# Patient Record
Sex: Female | Born: 1970 | Race: White | Hispanic: No | Marital: Married | Smoking: Never smoker
Health system: Southern US, Community
[De-identification: ages and names within clinical notes are randomized; demographics above are authoritative.]

## PROBLEM LIST (undated history)

## (undated) DIAGNOSIS — R5381 Other malaise: Secondary | ICD-10-CM

## (undated) DIAGNOSIS — Q796 Ehlers-Danlos syndrome, unspecified: Secondary | ICD-10-CM

## (undated) DIAGNOSIS — D649 Anemia, unspecified: Secondary | ICD-10-CM

## (undated) DIAGNOSIS — R5383 Other fatigue: Secondary | ICD-10-CM

## (undated) DIAGNOSIS — M26609 Unspecified temporomandibular joint disorder, unspecified side: Secondary | ICD-10-CM

## (undated) DIAGNOSIS — A770 Spotted fever due to Rickettsia rickettsii: Secondary | ICD-10-CM

## (undated) DIAGNOSIS — M5020 Other cervical disc displacement, unspecified cervical region: Secondary | ICD-10-CM

## (undated) DIAGNOSIS — M5030 Other cervical disc degeneration, unspecified cervical region: Secondary | ICD-10-CM

## (undated) DIAGNOSIS — K220 Achalasia of cardia: Secondary | ICD-10-CM

## (undated) DIAGNOSIS — S0993XA Unspecified injury of face, initial encounter: Secondary | ICD-10-CM

## (undated) DIAGNOSIS — G5790 Unspecified mononeuropathy of unspecified lower limb: Secondary | ICD-10-CM

---

## 2005-12-24 HISTORY — PX: OTHER SURGICAL HISTORY: SHX169

## 2015-08-04 ENCOUNTER — Emergency Department (HOSPITAL_COMMUNITY)

## 2015-08-04 ENCOUNTER — Encounter (HOSPITAL_COMMUNITY): Payer: Self-pay | Admitting: *Deleted

## 2015-08-04 ENCOUNTER — Emergency Department (HOSPITAL_COMMUNITY)
Admission: EM | Admit: 2015-08-04 | Discharge: 2015-08-04 | Disposition: A | Discharge status: Home / Self Care | Attending: Emergency Medicine | Admitting: Emergency Medicine

## 2015-08-04 DIAGNOSIS — R079 Chest pain, unspecified: Secondary | ICD-10-CM | POA: Diagnosis present

## 2015-08-04 DIAGNOSIS — Z79899 Other long term (current) drug therapy: Secondary | ICD-10-CM | POA: Insufficient documentation

## 2015-08-04 DIAGNOSIS — Z862 Personal history of diseases of the blood and blood-forming organs and certain disorders involving the immune mechanism: Secondary | ICD-10-CM | POA: Insufficient documentation

## 2015-08-04 HISTORY — DX: Anemia, unspecified: D64.9

## 2015-08-04 LAB — BASIC METABOLIC PANEL
Anion gap: 10 (ref 5–15)
BUN: 5 mg/dL — AB (ref 6–20)
CALCIUM: 9.4 mg/dL (ref 8.9–10.3)
CO2: 25 mmol/L (ref 22–32)
CREATININE: 0.81 mg/dL (ref 0.44–1.00)
Chloride: 102 mmol/L (ref 101–111)
GFR calc Af Amer: >60 mL/min (ref >60)
GFR calc non Af Amer: >60 mL/min (ref >60)
GLUCOSE: 100 mg/dL — AB (ref 65–99)
Potassium: 3.8 mmol/L (ref 3.5–5.1)
Sodium: 137 mmol/L (ref 135–145)

## 2015-08-04 LAB — CBC
HCT: 35.2 % — ABNORMAL LOW (ref 36.0–46.0)
HEMOGLOBIN: 11.4 g/dL — AB (ref 12.0–15.0)
MCH: 27.1 pg (ref 26.0–34.0)
MCHC: 32.4 g/dL (ref 30.0–36.0)
MCV: 83.8 fL (ref 78.0–100.0)
Platelets: 269 10*3/uL (ref 150–400)
RBC: 4.2 MIL/uL (ref 3.87–5.11)
RDW: 13.9 % (ref 11.5–15.5)
WBC: 4.2 10*3/uL (ref 4.0–10.5)

## 2015-08-04 LAB — I-STAT TROPONIN, ED: TROPONIN I, POC: 0 ng/mL (ref 0.00–0.08)

## 2015-08-04 LAB — TROPONIN I

## 2015-08-04 MED ORDER — IOHEXOL 300 MG/ML  SOLN
150.0000 mL | Freq: Once | INTRAMUSCULAR | Status: DC | PRN
  Administered 2015-08-04: 100 mL via ORAL
  Filled 2015-08-04: qty 150

## 2015-08-04 NOTE — ED Provider Notes (Signed)
CSN: [id number]     Arrival date & time 08/04/15  1020 History   First MD Initiated Contact with Patient 08/04/15 1231     Chief Complaint  Patient presents with  . Chest Pain     (Consider location/radiation/quality/duration/timing/severity/associated sxs/prior Treatment) HPI Comments: 44 year old female with past medical history including laryngopharyngeal reflux, anemia who presents with chest pain. The patient has a long-standing history of reflux for which she has followed with ENT in the past. She was recently evaluated by gastroenterology and had an esophageal manometry study done 3 days ago. The procedure was uncomfortable but no acute complications. Yesterday, she began having occasional episodes of central chest pain that radiates up towards bilateral shoulders and sometimes to her back. The pain occurs randomly and seems to be worse after eating or drinking. Nothing makes the pain better. She denies any associated shortness of breath, vomiting, diarrhea, abdominal pain, or blood in her stool. She saw her ENT yesterday who noted no abnormalities on bedside scope. She presents today due to the persistence of the pain. No personal history of blood clots. No recent travel. No OCP use.  Patient is a 44 y.o. female presenting with chest pain. The history is provided by the patient.  Chest Pain   Past Medical History  Diagnosis Date  . Laryngopharyngeal reflux (LPR)   . Anemia    History reviewed. No pertinent past surgical history. History reviewed. No pertinent family history. Social History  Substance Use Topics  . Smoking status: Never Smoker   . Smokeless tobacco: None  . Alcohol Use: No   OB History    No data available     Review of Systems  Cardiovascular: Positive for chest pain.    10 Systems reviewed and are negative for acute change except as noted in the HPI.   Allergies  Review of patient's allergies indicates no known allergies.  Home Medications    Prior to Admission medications   Medication Sig Start Date End Date Taking? Authorizing Provider  pantoprazole (PROTONIX) 40 MG tablet Take 40 mg by mouth daily.  08/03/15  Yes Historical Provider, MD  ranitidine (ZANTAC) 300 MG capsule Take 300 mg by mouth every evening.  07/08/15  Yes Historical Provider, MD   BP 114/84 mmHg  Pulse 67  Temp(Src) 98.3 F (36.8 C) (Oral)  Resp 12  Ht 5\' 7"  (1.702 m)  Wt 164 lb (74.39 kg)  BMI 25.68 kg/m2  SpO2 100%  LMP 08/01/2015 Physical Exam  Constitutional: She is oriented to person, place, and time. She appears well-developed and well-nourished. No distress.  HENT:  Head: Normocephalic and atraumatic.  Moist mucous membranes  Eyes: Conjunctivae are normal. Pupils are equal, round, and reactive to light.  Neck: Neck supple.  Cardiovascular: Normal rate, regular rhythm and normal heart sounds.   No murmur heard. Pulmonary/Chest: Effort normal and breath sounds normal.  Abdominal: Soft. Bowel sounds are normal. She exhibits no distension. There is no tenderness.  Musculoskeletal: She exhibits no edema.  Neurological: She is alert and oriented to person, place, and time.  Fluent speech  Skin: Skin is warm and dry.  Psychiatric: She has a normal mood and affect. Judgment normal.  pleasant  Nursing note and vitals reviewed.   ED Course  Procedures (including critical care time) Labs Review Labs Reviewed  BASIC METABOLIC PANEL - Abnormal; Notable for the following:    Glucose, Bld 100 (*)    BUN 5 (*)    All other components within normal  limits  CBC - Abnormal; Notable for the following:    Hemoglobin 11.4 (*)    HCT 35.2 (*)    All other components within normal limits  TROPONIN I  Rosezena Sensor, ED    Imaging Review Dg Chest 2 View  08/04/2015   CLINICAL DATA:  Anterior chest pain.  EXAM: CHEST  2 VIEW  COMPARISON:  None.  FINDINGS: The heart size and mediastinal contours are within normal limits. Both lungs are clear. The  visualized skeletal structures are unremarkable.  IMPRESSION: Normal exam.   Electronically Signed   By: Francene Boyers M.D.   On: 08/04/2015 11:11   Dg Esophagus W/water Sol Cm  08/04/2015   CLINICAL DATA:  Esophageal manometry 3 days ago. Chest pain. Rule out perforation.  EXAM: ESOPHOGRAM/BARIUM SWALLOW  TECHNIQUE: Single contrast examination was performed using Omnipaque 300 followed by thin barium.  FLUOROSCOPY TIME:  Radiation Exposure Index (as provided by the fluoroscopic device):  If the device does not provide the exposure index:  Fluoroscopy Time:  2 Min and 18 seconds  Number of Acquired Images:  COMPARISON:  None.  FINDINGS: The patient swallowed Omnipaque contrast and the esophagus was imaged in multiple projections. This was negative for leak. The patient then drank thin barium in upright and prone positions.  Negative for esophageal leak. Negative for extravasation of contrast. Small hiatal hernia. No stricture or mass lesion. No gastroesophageal reflux was elicited.  IMPRESSION: Negative for esophageal leak. No stricture or mass. Small hiatal hernia without reflux.   Electronically Signed   By: Marlan Palau M.D.   On: 08/04/2015 14:21     EKG Interpretation   Date/Time:  Thursday August 04 2015 10:30:18 EDT Ventricular Rate:  73 PR Interval:  136 QRS Duration: 76 QT Interval:  392 QTC Calculation: 431 R Axis:   79 Text Interpretation:  Normal sinus rhythm Normal ECG Confirmed by Micaella Gitto  MD, Milanni Ayub ([id number]) on 08/04/2015 12:33:40 PM      MDM   Final diagnoses:  Chest pain   44 year old female who presents with 2 days as chest pain in the setting of recent esophageal manometry study. At presentation, the patient was well-appearing with normal vital signs. No abnormal respiratory sounds and no abdominal tenderness on exam. EKG on arrival showed sinus rhythm with no ischemic changes. Obtained above lab work which showed stable anemia but was otherwise unremarkable.  Because  of patient's recent esophageal study, obtained an esophagram to evaluate for esophageal perforation.  Esophagram was negative for esophageal leak. Patient is PERC negative thus PE is very unlikely. She has no risk factors for early heart disease and HEART score is less than 3 based on negative serial troponins. Based on the patient's well appearance and reassuring workup, I feel that she is safe for discharge home. I have instructed her to follow-up as previously planned with ENT about her recent manometry study. She is currently trying to establish care with a PCP and I have provided her with outpatient clinic information. Have reviewed return precautions including worsening pain, shortness of breath, fever, or any new or alarming symptoms and the patient voiced understanding. Patient discharged in satisfactory condition.   Laurence Spates, MD 08/04/15 [phone]

## 2015-08-04 NOTE — Discharge Instructions (Signed)

## 2015-08-04 NOTE — ED Notes (Signed)
Dr. Little at bedside.  

## 2015-08-04 NOTE — ED Notes (Signed)
Pt reports having recent procedure done on esophagus on Monday, now having pains to different locations in chest and back. Denies sob.

## 2015-08-04 NOTE — ED Notes (Signed)
Pt A&OX4, ambulatory at d/c with steady gait, NAD 

## 2015-09-30 ENCOUNTER — Encounter (HOSPITAL_COMMUNITY)

## 2015-10-01 ENCOUNTER — Encounter (HOSPITAL_COMMUNITY): Payer: Self-pay | Admitting: Nurse Practitioner

## 2015-10-01 ENCOUNTER — Emergency Department (HOSPITAL_COMMUNITY)
Admission: EM | Admit: 2015-10-01 | Discharge: 2015-10-01 | Disposition: A | Discharge status: Home / Self Care | Attending: Emergency Medicine | Admitting: Emergency Medicine

## 2015-10-01 DIAGNOSIS — Z79899 Other long term (current) drug therapy: Secondary | ICD-10-CM | POA: Insufficient documentation

## 2015-10-01 DIAGNOSIS — R079 Chest pain, unspecified: Secondary | ICD-10-CM | POA: Diagnosis present

## 2015-10-01 DIAGNOSIS — Z8709 Personal history of other diseases of the respiratory system: Secondary | ICD-10-CM | POA: Diagnosis not present

## 2015-10-01 DIAGNOSIS — Z862 Personal history of diseases of the blood and blood-forming organs and certain disorders involving the immune mechanism: Secondary | ICD-10-CM | POA: Diagnosis not present

## 2015-10-01 LAB — CBC
HCT: 33.2 % — ABNORMAL LOW (ref 36.0–46.0)
Hemoglobin: 10.3 g/dL — ABNORMAL LOW (ref 12.0–15.0)
MCH: 25.3 pg — ABNORMAL LOW (ref 26.0–34.0)
MCHC: 31 g/dL (ref 30.0–36.0)
MCV: 81.6 fL (ref 78.0–100.0)
Platelets: 301 10*3/uL (ref 150–400)
RBC: 4.07 MIL/uL (ref 3.87–5.11)
RDW: 13.9 % (ref 11.5–15.5)
WBC: 4.5 10*3/uL (ref 4.0–10.5)

## 2015-10-01 LAB — BASIC METABOLIC PANEL
Anion gap: 8 (ref 5–15)
BUN: 11 mg/dL (ref 6–20)
CALCIUM: 8.9 mg/dL (ref 8.9–10.3)
CO2: 27 mmol/L (ref 22–32)
Chloride: 99 mmol/L — ABNORMAL LOW (ref 101–111)
Creatinine, Ser: 0.75 mg/dL (ref 0.44–1.00)
GFR calc Af Amer: >60 mL/min (ref >60)
GLUCOSE: 103 mg/dL — AB (ref 65–99)
Potassium: 3.9 mmol/L (ref 3.5–5.1)
Sodium: 134 mmol/L — ABNORMAL LOW (ref 135–145)

## 2015-10-01 LAB — I-STAT TROPONIN, ED: Troponin i, poc: 0 ng/mL (ref 0.00–0.08)

## 2015-10-01 MED ORDER — KETOROLAC TROMETHAMINE 15 MG/ML IJ SOLN: 15.0000 mg | Freq: Once | INTRAMUSCULAR | Status: DC

## 2015-10-01 MED ORDER — GI COCKTAIL ~~LOC~~: 30.0000 mL | Freq: Once | ORAL | Status: DC

## 2015-10-01 NOTE — Discharge Instructions (Signed)
If you were given medicines take as directed.  If you are on coumadin or contraceptives realize their levels and effectiveness is altered by many different medicines.  If you have any reaction (rash, tongues swelling, other) to the medicines stop taking and see a physician.    If your blood pressure was elevated in the ER make sure you follow up for management with a primary doctor or return for chest pain, shortness of breath or stroke symptoms.  Please follow up as directed and return to the ER or see a physician for new or worsening symptoms.  Thank you. Filed Vitals:   10/01/15 1734 10/01/15 1735 10/01/15 1736 10/01/15 1800  BP: 121/66  121/66 114/76  Pulse: 83 92 66 74  Temp:   98.1 F (36.7 C)   TempSrc:   Oral   Resp:  SpO2: 100% 97% 100% 100%

## 2015-10-01 NOTE — ED Notes (Signed)
Pt c/o middle/left sided CP, sob intermittent over past several weeks, sometimes wakes her from her sleep. She also c/o increased fatigue this week.  She is currently being worked up for GI issues/acid reflux/anemia by [employer], she was supposed to have diagnostic colonoscopy/endoscopy this week but had to cancel. She reports nausea/mild constipation, denies vomiting. Denies diarrhea.

## 2015-10-01 NOTE — ED Provider Notes (Signed)
CSN: [id number]     Arrival date & time 10/01/15  1602 History   First MD Initiated Contact with Patient 10/01/15 1719     Chief Complaint  Patient presents with  . Chest Pain     (Consider location/radiation/quality/duration/timing/severity/associated sxs/prior Treatment) HPI Comments: 44 year-old female with history of reflux, currently being worked up by gastroenterology for sense of food getting stuck with swallowing presents with intermittent left-sided chest pain for the past 2 weeks. Very brief randomassociations. No exertional component however patient has not been exercising recently due to reflux. Patient has been elevating her bed and not eating at night etc. to improve however symptoms have worsened recently. Patient is scheduled to have a EGD and colonoscopy in November. No diaphoresis. No blood clot risk factors.  Patient is a 44 y.o. female presenting with chest pain. The history is provided by the patient.  Chest Pain Associated symptoms: no abdominal pain, no back pain, no fever, no headache, no shortness of breath and not vomiting     Past Medical History  Diagnosis Date  . Laryngopharyngeal reflux (LPR)   . Anemia    History reviewed. No pertinent past surgical history. History reviewed. No pertinent family history. Social History  Substance Use Topics  . Smoking status: Never Smoker   . Smokeless tobacco: None  . Alcohol Use: No   OB History    No data available     Review of Systems  Constitutional: Negative for fever and chills.  HENT: Negative for congestion.   Eyes: Negative for visual disturbance.  Respiratory: Negative for shortness of breath.   Cardiovascular: Positive for chest pain.  Gastrointestinal: Negative for vomiting and abdominal pain.  Genitourinary: Negative for dysuria and flank pain.  Musculoskeletal: Negative for back pain, neck pain and neck stiffness.  Skin: Negative for rash.  Neurological: Negative for light-headedness and  headaches.      Allergies  Review of patient's allergies indicates no known allergies.  Home Medications   Prior to Admission medications   Medication Sig Start Date End Date Taking? Authorizing Provider  pantoprazole (PROTONIX) 40 MG tablet Take 40 mg by mouth daily.  08/03/15   Historical Provider, MD  ranitidine (ZANTAC) 300 MG capsule Take 300 mg by mouth every evening.  07/08/15   Historical Provider, MD   BP 114/76 mmHg  Pulse 74  Temp(Src) 98.1 F (36.7 C) (Oral)  Resp 17  SpO2 100%  LMP 09/27/2015 Physical Exam  Constitutional: She is oriented to person, place, and time. She appears well-developed and well-nourished.  HENT:  Head: Normocephalic and atraumatic.  Eyes: Conjunctivae are normal. Right eye exhibits no discharge. Left eye exhibits no discharge.  Neck: Normal range of motion. Neck supple. No tracheal deviation present.  Cardiovascular: Normal rate, regular rhythm and intact distal pulses.   No murmur heard. Pulmonary/Chest: Effort normal and breath sounds normal.  Abdominal: Soft. She exhibits no distension. There is no tenderness. There is no guarding.  Musculoskeletal: She exhibits no edema.  Neurological: She is alert and oriented to person, place, and time.  Skin: Skin is warm. No rash noted.  Psychiatric: She has a normal mood and affect.  Nursing note and vitals reviewed.   ED Course  Procedures (including critical care time) Labs Review Labs Reviewed  BASIC METABOLIC PANEL - Abnormal; Notable for the following:    Sodium 134 (*)    Chloride 99 (*)    Glucose, Bld 103 (*)    All other components within normal limits  CBC - Abnormal; Notable for the following:    Hemoglobin 10.3 (*)    HCT 33.2 (*)    MCH 25.3 (*)    All other components within normal limits  I-STAT TROPOININ, ED    Imaging Review No results found. I have personally reviewed and evaluated these images and lab results as part of my medical decision-making.   EKG  Interpretation   Date/Time:  Saturday October 01 2015 16:08:40 EDT Ventricular Rate:  80 PR Interval:  152 QRS Duration: 82 QT Interval:  402 QTC Calculation: 463 R Axis:   67 Text Interpretation:  Normal sinus rhythm Normal ECG Confirmed by Maybree Riling   MD, Makeila Yamaguchi ([id number]) on 10/01/2015 5:19:15 PM      MDM   Final diagnoses:  Chest pain, unspecified chest pain type    Patient presents with intermittent atypical left-sided chest pain. Patient very low risk cardiac, EKG unremarkable reviewed, troponin negative. Patient very low risk pulmonary embolism, PE RC negative.  Offered and discussed different treatments, patient wishes to hold on any medicines at this time as she is following with gastroenterology for her other medical problem.   Results and differential diagnosis were discussed with the patient/parent/guardian. Xrays were independently reviewed by myself.  Close follow up outpatient was discussed, comfortable with the plan.   Medications - No data to display  Filed Vitals:   10/01/15 1735 10/01/15 1736 10/01/15 1800 10/01/15 1830  BP:  121/66 114/76 122/77  Pulse: 92 66 74 67  Temp:  98.1 F (36.7 C)    TempSrc:  Oral    Resp: SpO2: 97% 100% 100% 99%    Final diagnoses:  Chest pain, unspecified chest pain type      Blane Ohara, MD 10/01/15 1900

## 2015-10-03 ENCOUNTER — Telehealth (HOSPITAL_BASED_OUTPATIENT_CLINIC_OR_DEPARTMENT_OTHER): Payer: Self-pay | Admitting: Emergency Medicine

## 2015-10-04 ENCOUNTER — Other Ambulatory Visit (HOSPITAL_COMMUNITY): Payer: Self-pay

## 2015-10-05 ENCOUNTER — Encounter (HOSPITAL_COMMUNITY)

## 2016-02-27 ENCOUNTER — Encounter (HOSPITAL_COMMUNITY): Payer: Self-pay | Admitting: Family Medicine

## 2016-02-27 ENCOUNTER — Emergency Department (HOSPITAL_COMMUNITY)
Admission: EM | Admit: 2016-02-27 | Discharge: 2016-02-28 | Disposition: A | Discharge status: Home / Self Care | Attending: Emergency Medicine | Admitting: Emergency Medicine

## 2016-02-27 DIAGNOSIS — Z79899 Other long term (current) drug therapy: Secondary | ICD-10-CM | POA: Insufficient documentation

## 2016-02-27 DIAGNOSIS — R1310 Dysphagia, unspecified: Secondary | ICD-10-CM | POA: Diagnosis not present

## 2016-02-27 DIAGNOSIS — R51 Headache: Secondary | ICD-10-CM | POA: Insufficient documentation

## 2016-02-27 DIAGNOSIS — Z8709 Personal history of other diseases of the respiratory system: Secondary | ICD-10-CM | POA: Insufficient documentation

## 2016-02-27 DIAGNOSIS — R061 Stridor: Secondary | ICD-10-CM | POA: Diagnosis not present

## 2016-02-27 DIAGNOSIS — R202 Paresthesia of skin: Secondary | ICD-10-CM | POA: Diagnosis present

## 2016-02-27 DIAGNOSIS — D649 Anemia, unspecified: Secondary | ICD-10-CM | POA: Diagnosis not present

## 2016-02-27 DIAGNOSIS — R200 Anesthesia of skin: Secondary | ICD-10-CM | POA: Insufficient documentation

## 2016-02-27 DIAGNOSIS — F419 Anxiety disorder, unspecified: Secondary | ICD-10-CM | POA: Diagnosis not present

## 2016-02-27 DIAGNOSIS — H9201 Otalgia, right ear: Secondary | ICD-10-CM | POA: Insufficient documentation

## 2016-02-27 DIAGNOSIS — R220 Localized swelling, mass and lump, head: Secondary | ICD-10-CM | POA: Diagnosis not present

## 2016-02-27 DIAGNOSIS — K0889 Other specified disorders of teeth and supporting structures: Secondary | ICD-10-CM | POA: Diagnosis not present

## 2016-02-27 MED ORDER — IBUPROFEN 400 MG PO TABS
400.0000 mg | ORAL_TABLET | Freq: Once | ORAL | Status: AC
  Administered 2016-02-27: 400 mg via ORAL

## 2016-02-27 MED ORDER — IBUPROFEN 400 MG PO TABS
ORAL_TABLET | ORAL | Status: AC
  Filled 2016-02-27: qty 1

## 2016-02-27 NOTE — ED Notes (Addendum)
Pt here for right sided dental pain, facial swelling and sts she feels like the swelling is going into the right side of her tongue and into throat.sts trouble swallowing and feels like it is getting worse with warm sensations into her chest ans shoulders. sts she has been wearing night guards due to jaw pain. sts she has been seeing a massage therapist. sts she is taking a muscle relaxer.

## 2016-02-28 ENCOUNTER — Emergency Department (HOSPITAL_COMMUNITY)

## 2016-02-28 DIAGNOSIS — R202 Paresthesia of skin: Secondary | ICD-10-CM

## 2016-02-28 NOTE — Consult Note (Signed)
Neurology Consultation Reason for Consult: Right facial numbness Referring Physician: Horton, C  CC: Right facial numbness  History is obtained from: Patient  HPI: Kathryn Riggs is a 45 y.o. female who initially presented due to an abnormal feeling in her ear as well as funny feeling in the back of her tongue. She states that she felt like her face was swollen and therefore presented to the emergency room. While she was in the emergency department waiting room for multiple hours, she began noticing that she had some right facial numbness. She did not notice any arm numbness at that time. When the nurse checked her sensation, she states that she noticed at that time that she did not feel things as well on her right arm as well. This is not something that she had noticed prior to being formally assessed.  She notes that she had some abnormal sensation after therapist doing therapy for TMJ pinched her nose on Friday. She continues to have a full sensation in her right ear and the numbness is improving, though still remains to some degree.  LKW: 3 PM tpa given?: no, outside of window, mild symptoms Premorbid modified rankin scale: 0    ROS: A 14 point ROS was performed and is negative except as noted in the HPI.    Past Medical History  Diagnosis Date  . Laryngopharyngeal reflux (LPR)   . Anemia      Family history: Father a stroke  Social History:  reports that she has never smoked. She does not have any smokeless tobacco history on file. She reports that she does not drink alcohol or use illicit drugs.   Exam: Current vital signs: BP 147/98 mmHg  Pulse 89  Temp(Src) 98 F (36.7 C) (Oral)  Resp 20  SpO2 100% Vital signs in last 24 hours: Temp:  [98 F (36.7 C)] 98 F (36.7 C) (03/06 1820) Pulse Rate:  [84-89] 89 (03/07 0024) Resp:  [20] 20 (03/06 2042) BP: (147-154)/(98-109) 147/98 mmHg (03/07 0022) SpO2:  [100 %] 100 % (03/07 0024)   Physical Exam  Constitutional:  Appears well-developed and well-nourished.  Psych: Affect appropriate to situation Eyes: No scleral injection HENT: No OP obstrucion Head: Normocephalic.  Cardiovascular: Normal rate and regular rhythm.  Respiratory: Effort normal and breath sounds normal to anterior ascultation GI: Soft.  No distension. There is no tenderness.  Skin: WDI  Neuro: Mental Status: Patient is awake, alert, oriented to person, place, month, year, and situation. Patient is able to give a clear and coherent history. No signs of aphasia or neglect Cranial Nerves: II: Visual Fields are full. Pupils are equal, round, and reactive to light.   III,IV, VI: EOMI without ptosis or diploplia.  V: Facial sensation is decreased on the right side of the face VII: Facial movement is symmetric.  VIII: hearing is intact to voice X: Uvula elevates symmetrically XI: Shoulder shrug is symmetric. XII: tongue is midline without atrophy or fasciculations.  Motor: Tone is normal. Bulk is normal. 5/5 strength was present in all four extremities.  Sensory: Sensation to pinprick in the bilateral arms and legs, and upper arm single time she stated that it was about 80% of the left side, but then on subsequent examinations is more equal. Cerebellar: FNF intact bilaterally   I have reviewed labs in epic and the results pertinent to this consultation are: BMP-unremarkable  Impression: 45 year old female with paresthesias of the right face setting of sensation of facial swelling. I think possibilities would include  some irritation the trigeminal nerve triggered initially by dental work and now exacerbated by the therapy that she had. This would not explain her arm symptoms. Also possible the migraine variant given that does have mild headache.  Recommendations: 1) MRI brain w/o contrast.  2) If negative, no further workup at this time.    Ritta Slot, MD [facility] [phone]  If 7pm- 7am, please page  neurology on call as listed in AMION.

## 2016-02-28 NOTE — Discharge Instructions (Signed)
Your MRI was negative.  You have no evidence of tongue swelling or oral swelling.  Please follow-up with your dentist and your PCP.  Paresthesia Paresthesia is an abnormal burning or prickling sensation. This sensation is generally felt in the hands, arms, legs, or feet. However, it may occur in any part of the body. Usually, it is not painful. The feeling may be described as:  Tingling or numbness.  Pins and needles.  Skin crawling.  Buzzing.  Limbs falling asleep.  Itching. Most people experience temporary (transient) paresthesia at some time in their lives. Paresthesia may occur when you breathe too quickly (hyperventilation). It can also occur without any apparent cause. Commonly, paresthesia occurs when pressure is placed on a nerve. The sensation quickly goes away after the pressure is removed. For some people, however, paresthesia is a long-lasting (chronic) condition that is caused by an underlying disorder. If you continue to have paresthesia, you may need further medical evaluation. HOME CARE INSTRUCTIONS Watch your condition for any changes. Taking the following actions may help to lessen any discomfort that you are feeling:  Avoid drinking alcohol.  Try acupuncture or massage to help relieve your symptoms.  Keep all follow-up visits as directed by your health care provider. This is important. SEEK MEDICAL CARE IF:  You continue to have episodes of paresthesia.  Your burning or prickling feeling gets worse when you walk.  You have pain, cramps, or dizziness.  You develop a rash. SEEK IMMEDIATE MEDICAL CARE IF:  You feel weak.  You have trouble walking or moving.  You have problems with speech, understanding, or vision.  You feel confused.  You cannot control your bladder or bowel movements.  You have numbness after an injury.  You faint.   This information is not intended to replace advice given to you by your health care provider. Make sure you discuss  any questions you have with your health care provider.   Document Released: 11/30/2002 Document Revised: 04/26/2015 Document Reviewed: 12/06/2014 Elsevier Interactive Patient Education [employer]2016 [employer]
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
## 2016-04-30 ENCOUNTER — Encounter: Payer: Self-pay | Admitting: Neurology

## 2016-04-30 ENCOUNTER — Telehealth: Payer: Self-pay | Admitting: Neurology

## 2016-04-30 ENCOUNTER — Ambulatory Visit (INDEPENDENT_AMBULATORY_CARE_PROVIDER_SITE_OTHER): Admitting: Neurology

## 2016-04-30 VITALS — BP 118/81 | HR 70 | Ht 67.0 in | Wt 165.8 lb

## 2016-04-30 DIAGNOSIS — R519 Headache, unspecified: Secondary | ICD-10-CM

## 2016-04-30 DIAGNOSIS — M7911 Myalgia of mastication muscle: Secondary | ICD-10-CM | POA: Insufficient documentation

## 2016-04-30 DIAGNOSIS — B999 Unspecified infectious disease: Secondary | ICD-10-CM

## 2016-04-30 DIAGNOSIS — M6788 Other specified disorders of synovium and tendon, other site: Secondary | ICD-10-CM | POA: Insufficient documentation

## 2016-04-30 DIAGNOSIS — R0683 Snoring: Secondary | ICD-10-CM

## 2016-04-30 DIAGNOSIS — M791 Myalgia, unspecified site: Secondary | ICD-10-CM | POA: Insufficient documentation

## 2016-04-30 DIAGNOSIS — M7661 Achilles tendinitis, right leg: Secondary | ICD-10-CM

## 2016-04-30 DIAGNOSIS — M7662 Achilles tendinitis, left leg: Secondary | ICD-10-CM | POA: Diagnosis not present

## 2016-04-30 DIAGNOSIS — R51 Headache: Secondary | ICD-10-CM | POA: Diagnosis not present

## 2016-04-30 DIAGNOSIS — M779 Enthesopathy, unspecified: Secondary | ICD-10-CM | POA: Diagnosis not present

## 2016-04-30 NOTE — Progress Notes (Signed)
[name]    Provider:  Dr Lucia Gaskins Referring Provider: Alysia Penna, MD Primary Care Physician:  Alysia Penna, MD  CC:  Masticatory myalgia  HPI:  Kathryn Riggs is a 45 y.o. female here as a referral from Dr. Link Snuffer for facial pain. She has had a lot of "bizarre symptoms". She was in in [place] from 2009-2011. She was very active at the time. Her bowel movements started changing. She was dismissed. In 2013 she started having terrible achilles pain, synovitis, tendinosis. Burning pain in the ankles and tops of the feet. She had a mild foot drop due to being a boot or possibly for low back pain. In 2015 she had dysphagia, was seen by an ENT in 2016 and she had laryngeal reflux and omeprazole helped a little. EGD was normal. They moved in July and she went to [facility] and had a hiatal hernia, esophageal manometry showed weak peristalsis then had several other tests including colonoscopy. She is improved with the dysphagia. She went to [facility] and she had a still sample and she had a tape worm and giardia. She went to [facility] in [place] and saw doctor Earley Abide. She started notcing her jaw was bothering her in August, random aching in the jaw. She saw a dentist and saw no reason for pain. Second opinion said also no reason for the bruxism. She has an aching jaw. Worse since seeing the dentist and having a tooth replaced. She wears a night guard. Flexeril has helped a lot at night. She is taking  at night. She has seen orofacial pain clinic at [facility] and gotten relaxation techniques and more flexeril. She has been to integrative therapies. She has jaw pain, she having difficulty chewing food, decreased ROM, improved with the flexeril. Patient would like a referral to infectious disease to discuss. She has been to rheumatology as well. She is snoring, excessive daytime fatigue, and morning headaches.  Reviewed notes, labs and  imaging from outside physicians, which showed: Reviewed notes in Epic. Patient was seen in the emergency room in March 2017 by neurology. She was complaining of an abnormal feeling in her ear as well as a funny feeling in the back of her tongue. Felt like her face was swollen. Then she noticed some right facial numbness while in the emergency room. When she noticed right arm sensory changes as well. This was new. Stroke evaluation was performed. TPA was not given. Neurologic exam was normal except for some subjective facial sensation decreased on the right side. MRI of the brain did not show any acute abnormality and she was discharged home. She was previously seen in the ED and August 2016 for chest pain. EKG showed normal sinus rhythm at QTC 431. Patient was evaluated and discharged home.  Patient was seen at [facility] on 29th of March 2017 for persistent facial pain. Pain started in December 2016. Constant dull aching tension pain. In the bilateral masseter areas of episodes of pain in the upper incisors pain aggravated by chewing and relieved with the use of a mouth guard. She had pain upon palpation of the bilateral masseters, temporalis and SCM muscles. No pain upon palpation of the TMJ capsules. Mandibular range of motion within normal limits. No TMJ sounds noted. No gross oral pathology. Try oral exam within normal limits. Recommendations included patient education and reassurance, soft diet, restricted jaw function, fair function awareness, have reversal, thermotherapy, stretching exercises and psychiatric evaluation.  MRI of  the brain reviewed and agree with the following:  No mass lesion, midline shift, or mass effect. No hydrocephalus. No extra-axial fluid collection.  Craniocervical junction within normal limits. Visualized upper cervical spine unremarkable.  Pituitary gland normal. No acute abnormality about the orbits.  Paranasal sinuses are clear. No mastoid effusion. Inner ear structures  normal.  Bone marrow signal intensity within normal limits. No scalp soft tissue abnormality.  IMPRESSION: Normal brain MRI for patient age. No acute intracranial infarct or other process identified.   Review of Systems: Patient complains of symptoms per HPI as well as the following symptoms: Fatigue, anemia, chest pain, snoring, joint pain, aching muscles, diarrhea, trouble swallowing, headache, numbness, dizziness, anxiety, not enough sleep, sleepiness, snoring, dysphagia, esophageal weak peristalsis, Achilles tendinosis . Pertinent negatives per HPI. All others negative.   Social History   Social History  . Marital Status: Married    Spouse Name: N/A  . Number of Children: N/A  . Years of Education: N/A   Occupational History  . Not on file.   Social History Main Topics  . Smoking status: Never Smoker   . Smokeless tobacco: Not on file  . Alcohol Use: No  . Drug Use: No  . Sexual Activity: Not on file   Other Topics Concern  . Not on file   Social History Narrative    Family History  Problem Relation Age of Onset  . Stroke Neg Hx   . Neuropathy Neg Hx   . Multiple sclerosis Neg Hx   . Migraines Neg Hx     Past Medical History  Diagnosis Date  . Laryngopharyngeal reflux (LPR)   . Anemia     No past surgical history on file.  Current Outpatient Prescriptions  Medication Sig Dispense Refill  . Ascorbic Acid (VITAMIN C PO) Take 2,000 mg by mouth daily. Liposomal    . Cholecalciferol (VITAMIN D) 2000 units CAPS Take 4,000 Units by mouth daily.    . cyclobenzaprine (FLEXERIL) 10 MG tablet Take 2.5 mg by mouth 3 (three) times daily as needed for muscle spasms.    . ferrous gluconate (FERGON) 324 MG tablet Take 324 mg by mouth daily with breakfast.    . Hyaluronic Acid-Vitamin C (HYALURONIC ACID PO) Take 1 capsule by mouth daily.    [name] ibuprofen (ADVIL,MOTRIN) 100 MG/5ML suspension Take 500 mg by mouth every 6 (six) hours as needed for mild pain.    [name]  MAGNESIUM PO Take 1 tablet by mouth every other day.    . Misc Natural Products (ADRENAL PO) Take 1 capsule by mouth daily.    [name] UNABLE TO FIND Take 1 Dose by mouth daily. Med Name: Neocell: collagen drink mix per pt     No current facility-administered medications for this visit.    Allergies as of 04/30/2016  . (No Known Allergies)    Vitals: BP 118/81 mmHg  Pulse 70  Ht 5\' 7"  (1.702 m)  Wt 165 lb 12.8 oz (75.206 kg)  BMI 25.96 kg/m2 Last Weight:  Wt Readings from Last 1 Encounters:  04/30/16 165 lb 12.8 oz (75.206 kg)   Last Height:   Ht Readings from Last 1 Encounters:  04/30/16 5\' 7"  (1.702 m)   Physical exam: Exam: Gen: NAD, conversant, well nourised, well groomed                     CV: RRR, no MRG. No Carotid Bruits. No peripheral edema, warm, nontender Eyes: Conjunctivae clear without exudates or  hemorrhage  Neuro: Detailed Neurologic Exam  Speech:    Speech is normal; fluent and spontaneous with normal comprehension.  Cognition:    The patient is oriented to person, place, and time;     recent and remote memory intact;     language fluent;     normal attention, concentration,     fund of knowledge Cranial Nerves:    The pupils are equal, round, and reactive to light. The fundi are normal and spontaneous venous pulsations are present. Visual fields are full to finger confrontation. Extraocular movements are intact. Trigeminal sensation is intact and the muscles of mastication are normal. The face is symmetric. The palate elevates in the midline. Hearing intact. Voice is normal. Shoulder shrug is normal. The tongue has normal motion without fasciculations.   Coordination:    Normal finger to nose and heel to shin. Normal rapid alternating movements.   Gait:    Heel-toe and tandem gait are normal.   Motor Observation:    No asymmetry, no atrophy, and no involuntary movements noted. Tone:    Normal muscle tone.    Posture:    Posture is normal. normal  erect    Strength:    Strength is V/V in the upper and lower limbs.      Sensation: intact to LT     Reflex Exam:  DTR's:    Deep tendon reflexes in the upper and lower extremities are normal bilaterally.   Toes:    The toes are downgoing bilaterally.   Clonus:    Clonus is absent.        Assessment/Plan:  45 year old patient with a past medical history significant for multiple complaints, she reports she was recently diagnosed with tape worm and giardia.  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 She declines at this time.   Naomie DeanAntonia Ahern, MD  [facility] [address] [place], [place] [id number]  Phone [phone] Fax [phone]

## 2016-04-30 NOTE — Patient Instructions (Signed)
Remember to drink plenty of fluid, eat healthy meals and do not skip any meals. Try to eat protein with a every meal and eat a healthy snack such as fruit or nuts in between meals. Try to keep a regular sleep-wake schedule and try to exercise daily, particularly in the form of walking, 20-30 minutes a day, if you can.   As far as your medications are concerned, I would like to suggest: Continue Flexeril at night  As far as diagnostic testing: Sleep evaluation, referral to infectious disease, labs, Botox for masticatory muscles  I would like to see you back as needed, sooner if we need to. Please call us with any interim questions, concerns, problems, updates or refill requests.   Our phone number is [phone]. We also have an after hours call service for urgent matters and there is a physician on-call for urgent questions. For any emergencies you know to call 911 or go to the nearest emergency room

## 2016-04-30 NOTE — Telephone Encounter (Signed)
Patient called, spoke with nurse at her children's pediatrician's office, nurse wanted to know if Dr. Lucia GaskinsAhern was referring her to infectious disease because she was symptomatic or because she has the two issues (tapeworm and giardia). Please call to advise.

## 2016-04-30 NOTE — Telephone Encounter (Signed)
Pt called back. Relayed to pt provider has not had an opportunity to look over chart but she would be called back

## 2016-04-30 NOTE — Telephone Encounter (Signed)
Dr Ahern- please advise 

## 2016-04-30 NOTE — Telephone Encounter (Signed)
I am referring her because patient wanted to discuss her infection(tapeworm and giardia) with infectious disease.

## 2016-05-01 LAB — ANA COMPREHENSIVE PANEL
Centromere Ab Screen: <0.2 AI (ref 0.0–0.9)
ENA RNP Ab: <0.2 AI (ref 0.0–0.9)
ENA SM Ab Ser-aCnc: <0.2 AI (ref 0.0–0.9)
ENA SSA (RO) Ab: <0.2 AI (ref 0.0–0.9)
dsDNA Ab: 5 IU/mL (ref 0–9)

## 2016-05-01 LAB — RHEUMATOID FACTOR: Rhuematoid fact SerPl-aCnc: <10 IU/mL (ref 0.0–13.9)

## 2016-05-01 LAB — CK: CK TOTAL: 64 U/L (ref 24–173)

## 2016-05-01 NOTE — Telephone Encounter (Signed)
Called pt. Relayed Dr Lucia GaskinsAhern message below. She verbalized understanding. She stated her whole family got tested and tested positive for both.

## 2016-05-02 ENCOUNTER — Telehealth: Payer: Self-pay | Admitting: *Deleted

## 2016-05-02 NOTE — Telephone Encounter (Signed)
Spoke with patient to inform her per Dr Lucia GaskinsAhern her lab results are normal. She stated she saw her results on My Chart, thanked this RN for the call.

## 2016-05-10 ENCOUNTER — Telehealth: Payer: Self-pay | Admitting: Neurology

## 2016-05-10 NOTE — Telephone Encounter (Signed)
Called pt back. Advised we had sent referral to [facility] Snider, infectious disease on 04/30/16 already. She states she has not heard back from them to schedule yet. I told her to call [phone] to check on status of referral. I advised it can sometimes take about 1-2 weeks for a referral to be processed. She states she was talking to some of her physical therapists about her situation and they suggested [facility] go [facility]. She is wondering if she can get a referral there instead. I suggested she call Dr Drue SecondSnider office to see when she can get appt and I will check with Dr Lucia GaskinsAhern to see if she is ok to refer her to [facility]. Advised pt she may have to wait to get an [facility] as well. She will call me back and let me know.

## 2016-05-10 NOTE — Telephone Encounter (Signed)
Pt called in very emotional about talking to Dr. Stanford BreedSniders office. Pt is requesting that [facility]. She says that this is affecting her children as well. Please call pt at [phone]

## 2016-05-10 NOTE — Telephone Encounter (Signed)
Pt has called back and said she will bring tests results from [facility] to the clinic for Dr Lucia GaskinsAhern to look at.

## 2016-05-10 NOTE — Telephone Encounter (Signed)
Dr Lucia GaskinsAhern- please call pt, thank you  Called pt back. Relayed Dr Lucia GaskinsAhern message below. She stated when she called [facility] Snider office, they told her they responded to referral and stated they needed results from parasitology report first before they can schedule her. She states she is frustrated with their office and had to wait 30 minutes before speaking to anyone.  Pt states she is very concerned and not sure where to go from here. I highly encouraged her to contact PCP and ask for referral [facility]. She is requesting a call back from Dr Lucia GaskinsAhern directly to discuss everything. Advised Dr Lucia GaskinsAhern is still seeing pt and I will give her to message. It may not be until tomorrow when she gets a return call. She verbalized understanding.

## 2016-05-10 NOTE — Telephone Encounter (Signed)
Pt called requesting referral to be [facility] for [facility]. Physician Phone # [phone]

## 2016-05-10 NOTE — Telephone Encounter (Signed)
Dr Ahern- please advise, thank you 

## 2016-05-10 NOTE — Telephone Encounter (Signed)
It is our policy that she needs to get that referral from her primary care thanks

## 2016-05-11 NOTE — Telephone Encounter (Signed)
Pt called said she went to PCP office and was told they could not help her that [facility] and also saw that Dr Lucia GaskinsAhern has sent [facility]. When she called infectious diseases at [facility] she had to wait 30 min to talk with someone and then was told that" Dr Lucia GaskinsAhern should know that they could not see her until they rec'd the test results". Pt is tearful, feels she is getting the run around from other offices. Please call.

## 2016-05-11 NOTE — Telephone Encounter (Signed)
I'm sorry, I am not familiar with the whole history here. Do we have the parasitology report? And I can fax it over. Or what message do you want me to relay to patient?

## 2016-05-11 NOTE — Telephone Encounter (Signed)
I spoke to patient again because of her multiple calls. Patient requested a referral to infectious disease for her reported tape worm and giardia diagnosis so I complied with her request. I have explained to patient that I will refer her at her request because our policy at [facility] is always of course to try and comply with patient wishes and help the patient as we can. But I also told patient that it is completely up to [facility] discretion whether she will see this patient or not - and it takes time to review the referral. Patient dropped off her results and I can fax to Dr. Drue SecondSnider. Patient needs to go to her primary care for any other referral requests, this office will not entertain them, and we apologize to Dr. Drue SecondSnider and her office. Kara Mead(Emma, can you fax them please?) Thank you.

## 2016-05-14 ENCOUNTER — Ambulatory Visit (INDEPENDENT_AMBULATORY_CARE_PROVIDER_SITE_OTHER): Admitting: Neurology

## 2016-05-14 ENCOUNTER — Encounter: Payer: Self-pay | Admitting: Neurology

## 2016-05-14 VITALS — BP 110/80 | HR 86 | Resp 20 | Ht 67.0 in | Wt 166.0 lb

## 2016-05-14 DIAGNOSIS — G4700 Insomnia, unspecified: Secondary | ICD-10-CM | POA: Diagnosis not present

## 2016-05-14 DIAGNOSIS — F6811 Factitious disorder with predominantly psychological signs and symptoms: Secondary | ICD-10-CM | POA: Diagnosis not present

## 2016-05-14 DIAGNOSIS — F22 Delusional disorders: Secondary | ICD-10-CM

## 2016-05-14 DIAGNOSIS — G4763 Sleep related bruxism: Secondary | ICD-10-CM | POA: Insufficient documentation

## 2016-05-14 NOTE — Telephone Encounter (Signed)
Do you know anything about this patient? There is a slew of emails with my name/office putting her off but i don't recall reviewing anyone with potential parasites?  Can we ask margaret to follow through and see where the referral is at? Maybe need to see it if it is legit/or not

## 2016-05-14 NOTE — Telephone Encounter (Signed)
Checked with Claris CheMargaret and she advised that 04/30/16 Dr Luciana Axeomer sent back to the referring doctor and advised we needed ova and parasite testing prior to scheduling the patient and the patient called and told Claris CheMargaret  it had been done at a veterinary clinic. Claris CheMargaret told her to have a copy of it faxed and we have not gotten the lab copy nor heard anything back.

## 2016-05-14 NOTE — Telephone Encounter (Signed)
Faxed results to Dr Judyann Munsonynthia Snider as requested. Fax: [phone]. Received confirmation.

## 2016-05-14 NOTE — Patient Instructions (Signed)

## 2016-05-14 NOTE — Telephone Encounter (Signed)
i will check with our office, but i wasn't aware of the referral, one of my other partner's may have fielded it. Will check to see that she is seen if not [facility]

## 2016-05-14 NOTE — Progress Notes (Signed)
[name]    Provider:  Dr Kathryn Riggs Referring Provider: Alysia Penna, MD Primary Care Physician:  Kathryn Penna, MD  CC:   The patient reports that she has a detailed GI workup which documented only a small hiatal hernia. Her lab works have confirmed low ferritin levels in spite of IV iron supplementation. She has bilateral carpal tunnel syndrome. She has been seen by GI  at [facility] . She was then followed by ENT finally when she could not get results her arthralgia, myalgia were still present and she went to [facility] integrative medicine. 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 [facility] daughters and the patient herself. A microscopic stool sample was then again obtained at a veterinary's office, and apparently the histology or stool microscopic sample matched again Giardia and Tape worm.   These fears and concerns consume her . She sleeps poorly, is up and up at night.  She reports no problems to go to sleep to stay asleep. She wears a night guard, has Bruxism. She has jaw spasms.  She does not have noticed restless legs , also her ferritin levels were low which is a risk factor. Ferritin level was 18. He usually supplement iron for ferritin level of under 50. She had to been a fair iron infusions at [facility] in summer of last year. By August, she had moved to [place] [place] and was followed at [facility]. There a repeat ferritin level was obtained and at  42  She does not have anemia.  Her PT told her to follow up with Infectious disease.  [place] was referred to by Dr. Lucia Riggs.    Dr Kathryn Riggs note from 04-30-2016   HPI:  Kathryn Riggs is a 45 y.o. female here as a referral from Dr. Link Riggs for facial pain. She has had a lot of "bizarre symptoms". She was in in [place] from 2009-2011. She was very active at the time. Her bowel movements started changing. She was dismissed. In  2013 she started having terrible achilles pain, synovitis, tendinosis. Burning pain in the ankles and tops of the feet. She had a mild foot drop due to being a boot or possibly for low back pain. In 2015 she had dysphagia, was seen by an ENT in 2016 and she had laryngeal reflux and omeprazole helped a little. EGD was normal. They moved in July and she went to [facility] and had a hiatal hernia, esophageal manometry showed weak peristalsis then had several other tests including colonoscopy. She is improved with the dysphagia. She went to [facility] and she had a still sample and she had a tape worm and giardia. She went to [facility] in [place] and saw  Kathryn Riggs. She started notcing her jaw was bothering her in August, random aching in the jaw. She saw a dentist and saw no reason for pain. Second opinion said also no reason for the bruxism. She has an aching jaw. Worse since seeing the dentist and having a tooth replaced. She wears a night guard. Flexeril has helped a lot at night. She is taking 5mg  at night. She has seen orofacial pain clinic at [facility] and gotten relaxation techniques and more flexeril. She has been to integrative therapies. She has jaw pain, she having difficulty chewing food, decreased ROM, improved with the flexeril. Patient would like a referral to infectious disease to discuss. She  has been to rheumatology as well. She is snoring, excessive daytime fatigue, and morning headaches.  Reviewed notes, labs and imaging from outside physicians, which showed: Reviewed notes in Epic. Patient was seen in the emergency room in March 2017 by neurology. She was complaining of an abnormal feeling in her ear as well as a funny feeling in the back of her tongue. Felt like her face was swollen. Then she noticed some right facial numbness while in the emergency room. When she noticed right arm sensory changes as well. This was new. Stroke evaluation was performed.  TPA was not given. Neurologic exam was normal except for some subjective facial sensation decreased on the right side. MRI of the brain did not show any acute abnormality and she was discharged home. She was previously seen in the ED and August 2016 for chest pain. EKG showed normal sinus rhythm at [facility] 431. Patient was evaluated and discharged home.  Patient was seen at [facility] on 29th of March 2017 for persistent facial pain. Pain started in December 2016. Constant dull aching tension pain. In the bilateral masseter areas of episodes of pain in the upper incisors pain aggravated by chewing and relieved with the use of a mouth guard. She had pain upon palpation of the bilateral masseters, temporalis and SCM muscles. No pain upon palpation of the TMJ capsules. Mandibular range of motion within normal limits. No TMJ sounds noted. No gross oral pathology. Try oral exam within normal limits. Recommendations included patient education and reassurance, soft diet, restricted jaw function, fair function awareness, have reversal, thermotherapy, stretching exercises and psychiatric evaluation.  MRI of the brain reviewed and agree with the following: IMPRESSION: Normal brain MRI for patient age. No acute intracranial infarct or other process identified.   Review of Systems: Patient complains of symptoms per HPI as well as the following symptoms: Fatigue, anemia, chest pain, snoring, joint pain, aching muscles, diarrhea, trouble swallowing, headache, numbness, dizziness, anxiety, not enough sleep, sleepiness, snoring, dysphagia, esophageal weak peristalsis, Achilles tendinosis . Pertinent negatives per HPI. All others negative.   Social History   Social History  . Marital Status: Married    Spouse Name: N/A  . Number of Children: N/A  . Years of Education: N/A   Occupational History  . Not on file.   Social History Main Topics  . Smoking status: Never Smoker   . Smokeless tobacco: Not on file  . Alcohol  Use: No  . Drug Use: No  . Sexual Activity: Not on file   Other Topics Concern  . Not on file   Social History Narrative    Family History  Problem Relation Age of Onset  . Stroke Neg Hx   . Neuropathy Neg Hx   . Multiple sclerosis Neg Hx   . Migraines Neg Hx     Past Medical History  Diagnosis Date  . Laryngopharyngeal reflux (LPR)   . Anemia     No past surgical history on file.  Current Outpatient Prescriptions  Medication Sig Dispense Refill  . Ascorbic Acid (VITAMIN C PO) Take 2,000 mg by mouth daily. Liposomal    . BILTRICIDE 600 MG tablet TK 1 T PO BID FOR 5 DAYS. REPEAT COURSE IN 2 WEEKS  0  . Cholecalciferol (VITAMIN D) 2000 units CAPS Take 4,000 Units by mouth daily.    . cyclobenzaprine (FLEXERIL) 10 MG tablet Take 2.5 mg by mouth 3 (three) times daily as needed for muscle spasms.    . ferrous gluconate (FERGON)  324 MG tablet Take 324 mg by mouth daily with breakfast.    . Hyaluronic Acid-Vitamin C (HYALURONIC ACID PO) Take 1 capsule by mouth daily.    [name] ibuprofen (ADVIL,MOTRIN) 100 MG/5ML suspension Take 500 mg by mouth every 6 (six) hours as needed for mild pain.    [name] MAGNESIUM PO Take 1 tablet by mouth every other day.    . Misc Natural Products (ADRENAL PO) Take 1 capsule by mouth daily.    [name] UNABLE TO FIND Take 1 Dose by mouth daily. Med Name: Neocell: collagen drink mix per pt     No current facility-administered medications for this visit.    Allergies as of 05/14/2016  . (No Known Allergies)    Vitals: There were no vitals taken for this visit. Last Weight:  Wt Readings from Last 1 Encounters:  04/30/16 165 lb 12.8 oz (75.206 kg)   Last Height:   Ht Readings from Last 1 Encounters:  04/30/16  (1.702 m)   Physical exam: Exam: Gen: NAD, conversant, well nourised, well groomed                     CV: RRR, no MRG. No Carotid Bruits. No peripheral edema, warm, nontender Eyes: Conjunctivae clear without exudates or hemorrhage  Neck  circumference , 14.5 inches.  malllompati 2 . Normal  BMI.   Neuro:  Speech: Speech is normal; fluent and spontaneous with normal comprehension.  Cognition The patient is oriented to person, place, and time;     recent and remote memory intact;  language fluent; not pressured. normal attention, concentration, affect  and fund of knowledge Cranial Nerves: The pupils are equal, round, and reactive to light. The fundi are normal and spontaneous venous pulsations are present. Visual fields are full to finger confrontation. Extraocular movements are intact. Trigeminal sensation is intact and the muscles of mastication are normal. The face is symmetric. The palate elevates in the midline.  Hearing intact. Voice is normal. Shoulder shrug is normal. The tongue has normal motion without fasciculations.  Coordination: Normal finger to nose . Normal rapid alternating movements.  Gait: deferred.  Motor Observation:    No asymmetry, no atrophy, and no involuntary movements noted. Tone Normal muscle tone.   Posture: Posture is normal. normal erect Strength  Strength is V/V in the upper and lower limbs.    Sensation: intact to LT   DTR's: Deep tendon reflexes in the upper and lower extremities are normal bilaterally. The toes are downgoing bilaterally. Clonus is absent.   Assessment/Plan:  45 year old patient with a past medical history significant for multiple complaints, she reports she was recently diagnosed with tape worm and giardia.  She is snoring, excessive daytime fatigue, and morning headaches: sleep evaluation She has migratory pain, muscle pain, bruxism, paresthesias and joint pain:   The patient underwent a sleep study at [facility], actually an at home sleep test was performed. The patient lived in [place] at the time the study was interpreted by Dr. Eugenia Mcalpine, she was referred by Dr. Newt Lukes. Impressions were snoring, an AHI of 1. hour without any his hypoxemia. Normal  study. Of course, we don't know if the patient was truly awake and asleep. I would have recommended a in sleep, but the patient is by her own admission paranoid of bed bugs. I would like for her to not repeat a home sleep test as it has a little yield for her group of complaints and concerns. My  goal will be to arrange for an in lab sleep study. The patient should be screened for bruxism, and  organic reasons of insomnia. Treatment with hydoxyzine discussed. Insomnia rules reviewed.    Dr Kathryn GaskinsAhern has performed a comprehensive ANA panel and RF Recent Dx of tape worm and giardia: She would like to discuss with infectious disease, will refer to Judyann Munsonynthia Snider Recommend Botox in the masticatory muscles due to bruxism. She declines at this time.   Tifanie Gardiner, MD  [facility] [address] [place], [place] [id number]  Phone [phone] Fax [phone]

## 2016-05-14 NOTE — Telephone Encounter (Signed)
Annabelle Harmanana, after we fax the results(check with Kara Meademma) can you call [facility] and ensure they got them? thanks

## 2016-05-15 NOTE — Telephone Encounter (Signed)
Unfortunately there is nothing else that I can do. She needs to follow up with pcp thanks

## 2016-05-15 NOTE — Telephone Encounter (Signed)
[facility] [phone] called to advise, No. Doctor will need ova and parasites lab exam to verify parasites before they will consider it. States what patient has from [facility] is a requisition not a lab test. Patient contacted their office on Friday to say [facility], however what they received was a [employer] requisition form not lab result.

## 2016-05-15 NOTE — Telephone Encounter (Signed)
Dr Lucia GaskinsAhern- FYI Called pt. Advised that Dr Drue SecondSnider office if requesting actual lab report, not requisition from vet office. She stated that is all they had to provide to her. She stated the vet she went to was very old school and does not have anything else to provide. I advised I tried to call vet office but I had to LVM. She stated he is only there at certain times. He travels d/t being a farm Administrator, [employer]. I highly encouraged her to call Dr Feliz BeamSnider's office and discuss with them what I just discussed with her. They may need to speak directly to vet about lab results. She states "I basically gave up on Friday. I am not sure where to go from here". I advised her to call Dr Drue SecondSnider office. She verbalized understanding and states she will try and call them one more time.

## 2016-05-15 NOTE — Telephone Encounter (Signed)
LVM for [facility] where pt had testing. Advised we need actual results report. Pt dropped off requisition report to us. Infectious disease office where we are referring pt is requesting results. Gave [facility] phone number for them to call us back.

## 2016-05-16 ENCOUNTER — Telehealth: Payer: Self-pay | Admitting: Neurology

## 2016-05-16 NOTE — Telephone Encounter (Signed)
I called pt to discuss. No answer, left a message asking her to call me back.  What referral? To infectious disease?

## 2016-05-16 NOTE — Telephone Encounter (Addendum)
error 

## 2016-05-16 NOTE — Telephone Encounter (Signed)
I spoke to pt. She says that her PCP will not see her regarding her parasite problem since she has already been seen by a GI doctor and a neurologist. Dr. Feliz BeamSnider's office (infectious disease) will not see this pt because she does not have an "official" lab report from from the vetrenarian office where she had her stool tested.  She wants to know if Dr. Lucia GaskinsAhern will order a GI pathogen panel. I advised pt that I will speak to Dr. Lucia GaskinsAhern and I will call the pt back with Dr. Trevor MaceAhern's recommendations.  I personally spoke with Dr. 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   I called pt and explained this to her. Pt verbalized understanding. I encouraged her to call Dr. Alphonsus SiasHolwerda's office. Pt says "They think I am crazy, and maybe I am, so they don't want me as a patient." I again explained that Dr. Lucia GaskinsAhern cannot order the GI pathogen panel, and to follow up with Dr. Alphonsus SiasHolwerda's office and her GI physician. Pt verbalized understanding.

## 2016-05-16 NOTE — Telephone Encounter (Signed)
Patient is returning a call regarding an infectious disease referral.

## 2016-05-16 NOTE — Telephone Encounter (Signed)
Pt called in and is requesting an actual order for a gi pathogen panel. She also said she requested a referral from her pcp and got it, however the appt was cancelled some how. She says she does not know what else to do. Please call back

## 2016-07-01 ENCOUNTER — Encounter

## 2016-07-03 ENCOUNTER — Encounter: Payer: Self-pay | Admitting: Neurology

## 2016-07-03 ENCOUNTER — Ambulatory Visit (INDEPENDENT_AMBULATORY_CARE_PROVIDER_SITE_OTHER): Admitting: Neurology

## 2016-07-03 VITALS — BP 120/85 | HR 71 | Ht 67.0 in | Wt 169.0 lb

## 2016-07-03 DIAGNOSIS — W57XXXA Bitten or stung by nonvenomous insect and other nonvenomous arthropods, initial encounter: Secondary | ICD-10-CM

## 2016-07-03 DIAGNOSIS — M791 Myalgia, unspecified site: Secondary | ICD-10-CM

## 2016-07-03 DIAGNOSIS — M2550 Pain in unspecified joint: Secondary | ICD-10-CM | POA: Diagnosis not present

## 2016-07-03 MED ORDER — DULOXETINE HCL 30 MG PO CPEP: 30.0000 mg | ORAL_CAPSULE | Freq: Every day | ORAL | Status: DC

## 2016-07-03 NOTE — Patient Instructions (Addendum)
Remember to drink plenty of fluid, eat healthy meals and do not skip any meals. Try to eat protein with a every meal and eat a healthy snack such as fruit or nuts in between meals. Try to keep a regular sleep-wake schedule and try to exercise daily, particularly in the form of walking, 20-30 minutes a day, if you can.   As far as your medications are concerned, I would like to suggest: Cymbalta 30mg  daily can increase to 60mg  if needed  As far as diagnostic testing: Labs today : Bartonella, Lyme, RMSF, Sed rate  I would like to see you back as needed, sooner if we need to. Please call us with any interim questions, concerns, problems, updates or refill requests.   Our phone number is [phone]. We also have an after hours call service for urgent matters and there is a physician on-call for urgent questions. For any emergencies you know to call 911 or go to the nearest emergency room  Duloxetine delayed-release capsules What is this medicine? DULOXETINE (doo LOX e teen) is used to treat depression, anxiety, and different types of chronic pain. This medicine may be used for other purposes; ask your health care provider or pharmacist if you have questions. What should I tell my health care provider before I take this medicine? They need to know if you have any of these conditions: -bipolar disorder or a family history of bipolar disorder -glaucoma -kidney disease -liver disease -suicidal thoughts or a previous suicide attempt -taken medicines called MAOIs like Carbex, Eldepryl, Marplan, Nardil, and Parnate within 14 days -an unusual reaction to duloxetine, other medicines, foods, dyes, or preservatives -pregnant or trying to get pregnant -breast-feeding How should I use this medicine? Take this medicine by mouth with a glass of water. Follow the directions on the prescription label. Do not cut, crush or chew this medicine. You can take this medicine with or without food. Take your medicine  at regular intervals. Do not take your medicine more often than directed. Do not stop taking this medicine suddenly except upon the advice of your doctor. Stopping this medicine too quickly may cause serious side effects or your condition may worsen. A special MedGuide will be given to you by the pharmacist with each prescription and refill. Be sure to read this information carefully each time. Talk to your pediatrician regarding the use of this medicine in children. While this drug may be prescribed for children as young as 777 years of age for selected conditions, precautions do apply. Overdosage: If you think you have taken too much of this medicine contact a poison control center or emergency room at once. NOTE: This medicine is only for you. Do not share this medicine with others. What if I miss a dose? If you miss a dose, take it as soon as you can. If it is almost time for your next dose, take only that dose. Do not take double or extra doses. What may interact with this medicine? Do not take this medicine with any of the following medications: -certain diet drugs like dexfenfluramine, fenfluramine -desvenlafaxine -linezolid -MAOIs like Azilect, Carbex, Eldepryl, Marplan, Nardil, and Parnate -methylene blue (intravenous) -milnacipran -thioridazine -venlafaxine This medicine may also interact with the following medications: -alcohol -aspirin and aspirin-like medicines -certain antibiotics like ciprofloxacin and enoxacin -certain medicines for blood pressure, heart disease, irregular heart beat -certain medicines for depression, anxiety, or psychotic disturbances -certain medicines for migraine headache like almotriptan, eletriptan, frovatriptan, naratriptan, rizatriptan, sumatriptan, zolmitriptan -certain medicines that  treat or prevent blood clots like warfarin, enoxaparin, and dalteparin -cimetidine -fentanyl -lithium -NSAIDS, medicines for pain and inflammation, like ibuprofen or  naproxen -phentermine -procarbazine -sibutramine -St. John's wort -theophylline -tramadol -tryptophan This list may not describe all possible interactions. Give your health care provider a list of all the medicines, herbs, non-prescription drugs, or dietary supplements you use. Also tell them if you smoke, drink alcohol, or use illegal drugs. Some items may interact with your medicine. What should I watch for while using this medicine? Tell your doctor if your symptoms do not get better or if they get worse. Visit your doctor or health care professional for regular checks on your progress. Because it may take several weeks to see the full effects of this medicine, it is important to continue your treatment as prescribed by your doctor. Patients and their families should watch out for new or worsening thoughts of suicide or depression. Also watch out for sudden changes in feelings such as feeling anxious, agitated, panicky, irritable, hostile, aggressive, impulsive, severely restless, overly excited and hyperactive, or not being able to sleep. If this happens, especially at the beginning of treatment or after a change in dose, call your health care professional. Bonita Quin may get drowsy or dizzy. Do not drive, use machinery, or do anything that needs mental alertness until you know how this medicine affects you. Do not stand or sit up quickly, especially if you are an older patient. This reduces the risk of dizzy or fainting spells. Alcohol may interfere with the effect of this medicine. Avoid alcoholic drinks. This medicine can cause an increase in blood pressure. This medicine can also cause a sudden drop in your blood pressure, which may make you feel faint and increase the chance of a fall. These effects are most common when you first start the medicine or when the dose is increased, or during use of other medicines that can cause a sudden drop in blood pressure. Check with your doctor for instructions on  monitoring your blood pressure while taking this medicine. Your mouth may get dry. Chewing sugarless gum or sucking hard candy, and drinking plenty of water may help. Contact your doctor if the problem does not go away or is severe. What side effects may I notice from receiving this medicine? Side effects that you should report to your doctor or health care professional as soon as possible: -allergic reactions like skin rash, itching or hives, swelling of the face, lips, or tongue -changes in blood pressure -confusion -dark urine -dizziness -fast talking and excited feelings or actions that are out of control -fast, irregular heartbeat -fever -general ill feeling or flu-like symptoms -hallucination, loss of contact with reality -light-colored stools -loss of balance or coordination -redness, blistering, peeling or loosening of the skin, including inside the mouth -right upper belly pain -seizures -suicidal thoughts or other mood changes -trouble concentrating -trouble passing urine or change in the amount of urine -unusual bleeding or bruising -unusually weak or tired -yellowing of the eyes or skin Side effects that usually do not require medical attention (report to your doctor or health care professional if they continue or are bothersome): -blurred vision -change in appetite -change in sex drive or performance -headache -increased sweating -nausea This list may not describe all possible side effects. Call your doctor for medical advice about side effects. You may report side effects to FDA at 1-800-FDA-1088. Where should I keep my medicine? Keep out of the reach of children. Store at room temperature between  20 and 25 degrees C (68 to 77 degrees F). Throw away any unused medicine after the expiration date. NOTE: This sheet is a summary. It may not cover all possible information. If you have questions about this medicine, talk to your doctor, pharmacist, or health care  provider.    2016, Elsevier/Gold Standard. (2013-12-01 16:10:96)

## 2016-07-03 NOTE — Progress Notes (Signed)
[name]    Provider:  Dr Lucia Gaskins Referring Provider: Alysia Penna, MD Primary Care Physician:  Alysia Penna, MD  CC: Masticatory myalgia and other multiple complaints  Interval history 07/03/2016:  She saw the GI and the Infectious disease and workup was negative. She was told maybe she has Fibromyalgia. Sent her for sleep eval due to difficulties She was seen at [facility] for orofacial pain and has been to physical therapy. ANA comprehensive panel was negative, rheumatoid factor negative. She is getting PT for her joint and muscle pain. Patient's been struggling for quite a long time with her multiple complaints. She recently saw a GI and infectious disease specialists and had a workup, she still ongoing workups. Nothing has been found. Discussed ANA comprehensive panel was negative, rheumatoid factor was negative. Looking back through records it does not appear she has ever been tested for Lyme or other tickborne illnesses. She thought she was, but she can't find any documentation either. We'll test her for Lyme, [facility] spotted fever, Bartonella and do a sedimentation rate which is nonspecific for inflammation. I discussed that I am really not sure that there is anything else we can do for her here in neurology however we could start Cymbalta or Lyrica or gabapentin or other medication and see if it helps to alleviate her symptoms. Discussed these medications and the different side effects and patient decided on Cymbalta. Discussed medication side effects as detailed in the patient instructions. We can start Cymbalta and happy to increase as long as patient tolerates it and needs an increase.  HPI: Kathryn Riggs is a 45 y.o. female here as a referral from Dr. Link Snuffer for facial pain. She has had a lot of "bizarre symptoms". She was in in [place] from 2009-2011. She was very active at the time. Her bowel movements started changing. She was dismissed. In 2013 she started  having terrible achilles pain, synovitis, tendinosis. Burning pain in the ankles and tops of the feet. She had a mild foot drop due to being a boot or possibly for low back pain. In 2015 she had dysphagia, was seen by an ENT in 2016 and she had laryngeal reflux and omeprazole helped a little. EGD was normal. They moved in July and she went to [facility] and had a hiatal hernia, esophageal manometry showed weak peristalsis then had several other tests including colonoscopy. She is improved with the dysphagia. She went to [facility] and she had a still sample and she had a tape worm and giardia. She went to [facility] in [place] and saw doctor Earley Abide. She started notcing her jaw was bothering her in August, random aching in the jaw. She saw a dentist and saw no reason for pain. Second opinion said also no reason for the bruxism. She has an aching jaw. Worse since seeing the dentist and having a tooth replaced. She wears a night guard. Flexeril has helped a lot at night. She is taking  at night. She has seen orofacial pain clinic at [facility] and gotten relaxation techniques and more flexeril. She has been to integrative therapies. She has jaw pain, she having difficulty chewing food, decreased ROM, improved with the flexeril. Patient would like a referral to infectious disease to discuss. She has been to rheumatology as well. She is snoring, excessive daytime fatigue, and morning headaches.  Reviewed notes, labs and imaging from outside physicians, which showed: Reviewed notes in Epic. Patient was seen in the  emergency room in March 2017 by neurology. She was complaining of an abnormal feeling in her ear as well as a funny feeling in the back of her tongue. Felt like her face was swollen. Then she noticed some right facial numbness while in the emergency room. When she noticed right arm sensory changes as well. This was new. Stroke evaluation was performed. TPA was not  given. Neurologic exam was normal except for some subjective facial sensation decreased on the right side. MRI of the brain did not show any acute abnormality and she was discharged home. She was previously seen in the ED and August 2016 for chest pain. EKG showed normal sinus rhythm at QTC 431. Patient was evaluated and discharged home.  Patient was [facility] 29th of March 2017 for persistent facial pain. Pain started in December 2016. Constant dull aching tension pain. In the bilateral masseter areas of episodes of pain in the upper incisors pain aggravated by chewing and relieved with the use of a mouth guard. She had pain upon palpation of the bilateral masseters, temporalis and SCM muscles. No pain upon palpation of the TMJ capsules. Mandibular range of motion within normal limits. No TMJ sounds noted. No gross oral pathology. Try oral exam within normal limits. Recommendations included patient education and reassurance, soft diet, restricted jaw function, fair function awareness, have reversal, thermotherapy, stretching exercises and psychiatric evaluation.  MRI of the brain reviewed and agree with the following:  No mass lesion, midline shift, or mass effect. No hydrocephalus. No extra-axial fluid collection.  Craniocervical junction within normal limits. Visualized upper cervical spine unremarkable.  Pituitary gland normal. No acute abnormality about the orbits.  Paranasal sinuses are clear. No mastoid effusion. Inner ear structures normal.  Bone marrow signal intensity within normal limits. No scalp soft tissue abnormality.  IMPRESSION: Normal brain MRI for patient age. No acute intracranial infarct or other process identified.   Review of Systems: Patient complains of symptoms per HPI as well as the following symptoms: Fatigue, anemia, chest pain, snoring, joint pain, aching muscles, diarrhea, trouble swallowing, headache, numbness, dizziness, anxiety, not enough sleep,  sleepiness, snoring, dysphagia, esophageal weak peristalsis, Achilles tendinosis . Pertinent negatives per HPI. All others negative.  Social History   Social History  . Marital Status: Married    Spouse Name: N/A  . Number of Children: N/A  . Years of Education: N/A   Occupational History  . Not on file.   Social History Main Topics  . Smoking status: Never Smoker   . Smokeless tobacco: Not on file  . Alcohol Use: No  . Drug Use: No  . Sexual Activity: Not on file   Other Topics Concern  . Not on file   Social History Narrative    Family History  Problem Relation Age of Onset  . Stroke Neg Hx   . Neuropathy Neg Hx   . Multiple sclerosis Neg Hx   . Migraines Neg Hx     Past Medical History  Diagnosis Date  . Laryngopharyngeal reflux (LPR)   . Anemia     History reviewed. No pertinent past surgical history.  Current Outpatient Prescriptions  Medication Sig Dispense Refill  . Ascorbic Acid (VITAMIN C PO) Take 2,000 mg by mouth daily. Liposomal    . Cholecalciferol (VITAMIN D) 2000 units CAPS Take 4,000 Units by mouth daily.    . cyclobenzaprine (FLEXERIL) 10 MG tablet Take 2.5 mg by mouth 3 (three) times daily as needed for muscle spasms.    .[name]  ferrous gluconate (FERGON) 324 MG tablet Take 324 mg by mouth daily with breakfast.    . Hyaluronic Acid-Vitamin C (HYALURONIC ACID PO) Take 1 capsule by mouth daily.    [name] ibuprofen (ADVIL,MOTRIN) 100 MG/5ML suspension Take 500 mg by mouth every 6 (six) hours as needed for mild pain.    [name] MAGNESIUM PO Take 1 tablet by mouth every other day.    . Misc Natural Products (ADRENAL PO) Take 1 capsule by mouth daily.    [name] UNABLE TO FIND Take 1 Dose by mouth daily. Med Name: Neocell: collagen drink mix per pt    . DULoxetine (CYMBALTA) 30 MG capsule Take 1 capsule (30 mg total) by mouth daily. 30 capsule 11   No current facility-administered medications for this visit.    Allergies as of 07/03/2016  . (No Known Allergies)     Vitals: BP 120/85 mmHg  Pulse 71  Ht 5\' 7"  (1.702 m)  Wt 169 lb (76.658 kg)  BMI 26.46 kg/m2 Last Weight:  Wt Readings from Last 1 Encounters:  07/03/16 169 lb (76.658 kg)   Last Height:   Ht Readings from Last 1 Encounters:  07/03/16 5\' 7"  (1.702 m)     Physical exam: Exam: Gen: NAD, conversant, well nourised, well groomed  CV: RRR, no MRG. No Carotid Bruits. No peripheral edema, warm, nontender Eyes: Conjunctivae clear without exudates or hemorrhage  Neuro: Detailed Neurologic Exam  Speech:  Speech is normal; fluent and spontaneous with normal comprehension.  Cognition:  The patient is oriented to person, place, and time;   recent and remote memory intact;   language fluent;   normal attention, concentration,   fund of knowledge Cranial Nerves:  The pupils are equal, round, and reactive to light. The fundi are normal and spontaneous venous pulsations are present. Visual fields are full to finger confrontation. Extraocular movements are intact. Trigeminal sensation is intact and the muscles of mastication are normal. The face is symmetric. The palate elevates in the midline. Hearing intact. Voice is normal. Shoulder shrug is normal. The tongue has normal motion without fasciculations.   Coordination:  Normal finger to nose and heel to shin. Normal rapid alternating movements.   Gait:  Heel-toe and tandem gait are normal.   Motor Observation:  No asymmetry, no atrophy, and no involuntary movements noted. Tone:  Normal muscle tone.   Posture:  Posture is normal. normal erect   Strength:  Strength is V/V in the upper and lower limbs.    Sensation: intact to LT   Reflex Exam:  DTR's:  Deep tendon reflexes in the upper and lower extremities are normal bilaterally.  Toes:  The toes are downgoing bilaterally.  Clonus:  Clonus is absent.       Assessment/Plan: 45 year old  patient with a past medical history significant for multiple complaints, she reports she was recently diagnosed with tape worm and giardia.   She is snoring, excessive daytime fatigue, and morning headaches: sleep evaluation pending She has migratory pain, muscle pain, bruxism, paresthesias and joint pain: Will perforn a comprehensive ANA panel and RF (negative). Today will check for tic-borne diseases.   Naomie Dean, MD  [facility] [address] [place], [place] [address]  Phone [phone] Fax [phone]  A total of 30 minutes was spent in with this patient face-to-face. Over half this time was spent on counseling patient on the myalgias, arthralgia, tick bite diagnosis and different therapeutic options available.

## 2016-07-05 ENCOUNTER — Telehealth: Payer: Self-pay | Admitting: Neurology

## 2016-07-05 ENCOUNTER — Other Ambulatory Visit: Payer: Self-pay | Admitting: Neurology

## 2016-07-05 ENCOUNTER — Encounter: Payer: Self-pay | Admitting: Neurology

## 2016-07-05 DIAGNOSIS — A770 Spotted fever due to Rickettsia rickettsii: Secondary | ICD-10-CM

## 2016-07-05 LAB — ROCKY MTN SPOTTED FVR ABS PNL(IGG+IGM)
RMSF IGG: NEGATIVE
RMSF IGM: 1.91 {index} — AB (ref 0.00–0.89)

## 2016-07-05 LAB — BARTONELLA ANITBODY PANEL
B HENSELAE IGM: NEGATIVE {titer}
B QUINTANA IGG: NEGATIVE {titer}
B QUINTANA IGM: NEGATIVE {titer}

## 2016-07-05 LAB — BARTONELLA ANTIBODY PANEL: B henselae IgG: NEGATIVE titer

## 2016-07-05 LAB — SEDIMENTATION RATE: Sed Rate: 2 mm/hr (ref 0–32)

## 2016-07-05 LAB — B. BURGDORFI ANTIBODIES: Lyme IgG/IgM Ab: <0.91 {ISR} (ref 0.00–0.90)

## 2016-07-05 MED ORDER — DOXYCYCLINE HYCLATE 100 MG PO CAPS: 100.0000 mg | ORAL_CAPSULE | Freq: Two times a day (BID) | ORAL | Status: DC

## 2016-07-05 NOTE — Telephone Encounter (Signed)
Kathryn Riggs, please print and mail letter.

## 2016-07-05 NOTE — Telephone Encounter (Signed)
I tried calling patient's home phone and there was no answer and mailbox was full. I also tried calling husband's number listed on DPR form and similar occurred. labs came back positive for [facility].Letter sent with  Labs attached. She can call our office for further information. We can treat her for 14 days with Doxycycline as is the treatment for [facility] 100mg  PO bid 14 days.  thank you.

## 2016-07-05 NOTE — Telephone Encounter (Signed)
Mailed pt letter as requested per Dr Lucia GaskinsAhern.

## 2016-07-11 NOTE — Telephone Encounter (Signed)
Patient is calling back regarding the letter she received about her having [facility]/ Please call and discuss.

## 2016-07-11 NOTE — Telephone Encounter (Signed)
LFt vm for patient about letter she receive for [facility].

## 2016-07-11 NOTE — Telephone Encounter (Signed)
Rn call patient back about if the pharmacy had the prescription for doxycycline. Pt stated she was on the way to get it. Pt stated whats the plan once she is done with the meds. Rn advise her to call back once she finish the meds to discuss with Dr Lucia GaskinsAhern. Pt verbalized understanding.

## 2016-07-11 NOTE — Telephone Encounter (Signed)
Rn receive incoming call from patient about the letter for [facility]. Pt stated she does recall being bitten with any insect, also she never got the Cymbalta prescription. She does not want to take it and wants Dr. Lucia GaskinsAhern to know that. Rn stated the labs were with her letter. Rn stated a prescription for doxycycline was sent 07/05/2016. Rn stated she can call to make sure the doxycycline is still there. Pt stated she hope they did not cancel the doxycycline prescription. PT will cal back if its not at the pharmacy.

## 2016-07-13 NOTE — Telephone Encounter (Signed)
Patient called back, has a question about doxycycline (VIBRAMYCIN) 100 MG capsule, please call [phone].

## 2016-07-13 NOTE — Telephone Encounter (Signed)
PC with Maikayla this morning--[facility] gave rx. for 2 wk. supply of Doxycycline--Jonell sts. [facility] told her last month that 7 days of Doxycycline should be sufficient, wants to know if she should take if for the full 2 weeks AA gave rx. for.  I have advised she take Doxycycline as rx'd by AA--it will not cause harm to take it for 14 days instead of 7.  She verbalized understanding of same, still seems hesitant to take it for 2 weeks and sts. she will call back next week to speak with AA/fim

## 2016-07-16 NOTE — Telephone Encounter (Signed)
Kathryn Riggs, I am going to treat her for 2 weeks with the doxycycline. That is the plan. That is the treatment. There is no other follow up from my perspective, she will need to follow up with her primary care for future follow up. thanks

## 2016-07-30 ENCOUNTER — Encounter (HOSPITAL_COMMUNITY): Payer: Self-pay

## 2016-07-30 ENCOUNTER — Emergency Department (HOSPITAL_COMMUNITY)
Admission: EM | Admit: 2016-07-30 | Discharge: 2016-07-30 | Disposition: A | Discharge status: Home / Self Care | Attending: Emergency Medicine | Admitting: Emergency Medicine

## 2016-07-30 DIAGNOSIS — M542 Cervicalgia: Secondary | ICD-10-CM | POA: Diagnosis not present

## 2016-07-30 DIAGNOSIS — R42 Dizziness and giddiness: Secondary | ICD-10-CM | POA: Diagnosis present

## 2016-07-30 DIAGNOSIS — R110 Nausea: Secondary | ICD-10-CM | POA: Diagnosis not present

## 2016-07-30 DIAGNOSIS — G8929 Other chronic pain: Secondary | ICD-10-CM | POA: Insufficient documentation

## 2016-07-30 HISTORY — DX: Spotted fever due to Rickettsia rickettsii: A77.0

## 2016-07-30 LAB — BASIC METABOLIC PANEL
ANION GAP: 7 (ref 5–15)
BUN: 12 mg/dL (ref 6–20)
CALCIUM: 8.9 mg/dL (ref 8.9–10.3)
CO2: 25 mmol/L (ref 22–32)
CREATININE: 0.71 mg/dL (ref 0.44–1.00)
Chloride: 106 mmol/L (ref 101–111)
Glucose, Bld: 95 mg/dL (ref 65–99)
Potassium: 3.6 mmol/L (ref 3.5–5.1)
SODIUM: 138 mmol/L (ref 135–145)

## 2016-07-30 LAB — URINALYSIS, ROUTINE W REFLEX MICROSCOPIC
BILIRUBIN URINE: NEGATIVE
Glucose, UA: NEGATIVE mg/dL
Ketones, ur: NEGATIVE mg/dL
LEUKOCYTES UA: NEGATIVE
Nitrite: NEGATIVE
PH: 5.5 (ref 5.0–8.0)
Protein, ur: NEGATIVE mg/dL
SPECIFIC GRAVITY, URINE: 1.007 (ref 1.005–1.030)

## 2016-07-30 LAB — CBC
HCT: 36.9 % (ref 36.0–46.0)
HEMOGLOBIN: 12.5 g/dL (ref 12.0–15.0)
MCH: 30.5 pg (ref 26.0–34.0)
MCHC: 33.9 g/dL (ref 30.0–36.0)
MCV: 90 fL (ref 78.0–100.0)
Platelets: 228 10*3/uL (ref 150–400)
RBC: 4.1 MIL/uL (ref 3.87–5.11)
RDW: 12.3 % (ref 11.5–15.5)
WBC: 8.4 10*3/uL (ref 4.0–10.5)

## 2016-07-30 LAB — URINE MICROSCOPIC-ADD ON

## 2016-07-30 LAB — PREGNANCY, URINE: Preg Test, Ur: NEGATIVE

## 2016-07-30 MED ORDER — MECLIZINE HCL 25 MG PO TABS
25.0000 mg | ORAL_TABLET | Freq: Once | ORAL | Status: AC
  Administered 2016-07-30: 25 mg via ORAL
  Filled 2016-07-30 (×2): qty 1

## 2016-07-30 MED ORDER — MECLIZINE HCL 25 MG PO TABS: 25.0000 mg | ORAL_TABLET | Freq: Three times a day (TID) | ORAL | 0 refills | Status: DC | PRN

## 2016-07-30 MED ORDER — METHOCARBAMOL 500 MG PO TABS
1000.0000 mg | ORAL_TABLET | Freq: Once | ORAL | Status: DC
Start: 2016-07-30 — End: 2016-07-31
  Filled 2016-07-30: qty 2

## 2016-07-30 NOTE — ED Notes (Signed)
Bed: WTR7 Expected date:  Expected time:  Means of arrival:  Comments: Labs 

## 2016-07-30 NOTE — ED Triage Notes (Signed)
Pt ambulatory triage c/o dizziness x3 days and 6/10 intermittent neck pain. Pt was dx'd with [facility] in 7/14. Pt denies loc. Pt denies recent falls. Pt A+OX4, speaking in complete sentences.

## 2016-07-30 NOTE — ED Provider Notes (Signed)
[facility]-EMERGENCY DEPT Provider Note   CSN: [id number] Arrival date & time: 07/30/16  1759  First Provider Contact:  First MD Initiated Contact with Patient 07/30/16 2104        History   Chief Complaint Chief Complaint  Patient presents with  . Dizziness  . Neck Pain    HPI Kathryn Riggs is a 45 y.o. female.  HPI Patient with history of episodic vertigo presents with vertiginous symptoms for the past 4 days. Symptoms improved with standing and worse with position change. Associated with nausea but no vomiting. No congestion the patient does have fullness in bilateral ears. No fever or chills. Denies any headache. Patient has chronic neck pain which is unchanged. No new trauma. Patient is tried no medication for her symptoms. Recently treated for recommend spotted fever. Completed 10 day course of doxycycline. Denies any focal weakness or numbness. No visual changes or gait disturbance. No new rashes.  Past Medical History:  Diagnosis Date  . Anemia   . Laryngopharyngeal reflux (LPR)   . Migraines   . [place] spotted fever     Patient Active Problem List   Diagnosis Date Noted  . Insomnia 05/14/2016  . 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 Muscle pain 04/30/2016  . Achilles tendinosis 04/30/2016    History reviewed. No pertinent surgical history.  OB History    No data available       Home Medications    Prior to Admission medications   Medication Sig Start Date End Date Taking? Authorizing Provider  Cholecalciferol (VITAMIN D3) 1000 units CHEW Chew 1 tablet by mouth daily.   Yes Historical Provider, MD  Multiple Vitamins-Minerals (MULTIVITAMIN ADULT) CHEW Chew 1 tablet by mouth daily.   Yes Historical Provider, MD  Probiotic Product (PROBIOTIC-10) CHEW Chew 1 tablet by mouth daily.   Yes Historical Provider, MD  doxycycline (VIBRAMYCIN) 100 MG capsule Take 1 capsule (100 mg total) by mouth 2 (two) times daily. Patient not  taking: Reported on 07/30/2016 07/05/16   Anson Fret, MD  DULoxetine (CYMBALTA) 30 MG capsule Take 1 capsule (30 mg total) by mouth daily. Patient not taking: Reported on 07/30/2016 07/03/16   Anson Fret, MD  meclizine (ANTIVERT) 25 MG tablet Take 1 tablet (25 mg total) by mouth 3 (three) times daily as needed for dizziness. 07/30/16   Loren Racer, MD    Family History Family History  Problem Relation Age of Onset  . Stroke Neg Hx   . Neuropathy Neg Hx   . Multiple sclerosis Neg Hx   . Migraines Neg Hx     Social History Social History  Substance Use Topics  . Smoking status: Never Smoker  . Smokeless tobacco: Never Used  . Alcohol use No     Allergies   Review of patient's allergies indicates no known allergies.   Review of Systems Review of Systems  Constitutional: Negative for chills, fatigue and fever.  HENT: Negative for congestion, ear discharge, ear pain, facial swelling, hearing loss, sinus pressure and sore throat.   Eyes: Negative for photophobia and visual disturbance.  Respiratory: Negative for cough and shortness of breath.   Cardiovascular: Negative for chest pain, palpitations and leg swelling.  Gastrointestinal: Positive for nausea. Negative for abdominal pain, constipation, diarrhea and vomiting.  Genitourinary: Negative for dysuria and flank pain.  Musculoskeletal: Positive for myalgias and neck pain. Negative for back pain and neck stiffness.  Skin: Negative for rash and wound.  Neurological: Positive for  dizziness. Negative for tremors, syncope, weakness, light-headedness, numbness and headaches.  All other systems reviewed and are negative.    Physical Exam Updated Vital Signs BP 122/76 (BP Location: Right Arm)   Pulse 75   Temp 98.2 F (36.8 C) (Oral)   Resp 15   Wt 168 lb 2 oz (76.3 kg)   LMP 07/29/2016 (Exact Date)   SpO2 98%   BMI 26.33 kg/m   Physical Exam  Constitutional: She is oriented to person, place, and time. She appears  well-developed and well-nourished.  HENT:  Head: Normocephalic and atraumatic.  Mouth/Throat: Oropharynx is clear and moist. No oropharyngeal exudate.  No sinus tenderness percussion. Patient has bilateral TM fullness without erythema.  Eyes: EOM are normal. Pupils are equal, round, and reactive to light.  Fatigable rotary nystagmus  Neck: Normal range of motion. Neck supple.  No lymphadenopathy. No neck tenderness with palpation. No bruits or masses. No definite meningeal signs.  Cardiovascular: Normal rate and regular rhythm.  Exam reveals no gallop and no friction rub.   No murmur heard. Pulmonary/Chest: Effort normal and breath sounds normal. No respiratory distress. She has no wheezes. She has no rales. She exhibits no tenderness.  Abdominal: Soft. Bowel sounds are normal. There is no tenderness. There is no rebound and no guarding.  Musculoskeletal: Normal range of motion. She exhibits no edema or tenderness.  No lower extremity swelling, asymmetry or tenderness. Distal pulses are equal and intact.  Lymphadenopathy:    She has no cervical adenopathy.  Neurological: She is alert and oriented to person, place, and time.  Patient is alert and oriented x3 with clear, goal oriented speech. Patient has 5/5 motor in all extremities. Sensation is intact to light touch. Bilateral finger-to-nose is normal with no signs of dysmetria.   Skin: Skin is warm and dry. Capillary refill takes less than 2 seconds. No rash noted. No erythema.  Psychiatric: She has a normal mood and affect. Her behavior is normal.  Nursing note and vitals reviewed.    ED Treatments / Results  Labs (all labs ordered are listed, but only abnormal results are displayed) Labs Reviewed  URINALYSIS, ROUTINE W REFLEX MICROSCOPIC (NOT AT [facility]) - Abnormal; Notable for the following:       Result Value   Hgb urine dipstick MODERATE (*)    All other components within normal limits  URINE MICROSCOPIC-ADD ON - Abnormal; Notable  for the following:    Squamous Epithelial / LPF 0-5 (*)    Bacteria, UA RARE (*)    All other components within normal limits  BASIC METABOLIC PANEL  CBC  PREGNANCY, URINE    EKG  EKG Interpretation  Date/Time:  Monday July 30 2016 22:35:29 EDT Ventricular Rate:  56 PR Interval:    QRS Duration: 76 QT Interval:  469 QTC Calculation: 453 R Axis:   61 Text Interpretation:  Sinus rhythm Baseline wander in lead(s) V6 Confirmed by Ranae Palms  MD, Aveline Daus ([id number]) on 07/30/2016 11:17:43 PM       Radiology No results found.  Procedures Procedures (including critical care time)  Medications Ordered in ED Medications  meclizine (ANTIVERT) tablet 25 mg (25 mg Oral Refused 07/30/16 2236)  methocarbamol (ROBAXIN) tablet 1,000 mg (1,000 mg Oral Refused 07/30/16 2236)     Initial Impression / Assessment and Plan / ED Course  I have reviewed the triage vital signs and the nursing notes.  Pertinent labs & imaging results that were available during my care of the patient were reviewed  by me and considered in my medical decision making (see chart for details).  Clinical Course   Patient refusing Antivert and Robaxin in the emergency department.  Is asking to be discharged home. Has no infective signs or symptoms. Neck pain appears to be chronic. No definite evidence of meningitis. Advised follow-up with her primary care physician. Return precautions given.   Final Clinical Impressions(s) / ED Diagnoses   Final diagnoses:  Vertigo  Chronic neck pain    New Prescriptions New Prescriptions   MECLIZINE (ANTIVERT) 25 MG TABLET    Take 1 tablet (25 mg total) by mouth 3 (three) times daily as needed for dizziness.     Loren Raceravid Marsella Suman, MD 07/30/16 [phone]

## 2016-07-30 NOTE — Telephone Encounter (Signed)
Patient called, states she finished Doxycycline, Thursday, Saturday, yesterday, today, right now feels something is not right, "with my head, feels like it's spinning", "just not do anything, just lie down", feels something isn't right, not sure who to go to for this. Please call [phone].

## 2016-07-30 NOTE — Telephone Encounter (Signed)
Dr Lucia GaskinsAhern- FYI Called and spoke to pt. Per Dr Lucia GaskinsAhern, she does not feel sx are related to doxycyline. She should f/u with PCP first. We are happy to schedule appt after seeing PCP if they feel it is neurological. She verbalized understanding.

## 2016-07-30 NOTE — ED Notes (Signed)
Pt ambulatory and independent at discharge.  Verbalized understanding of discharge instructions 

## 2016-08-03 ENCOUNTER — Other Ambulatory Visit: Payer: Self-pay | Admitting: Neurology

## 2016-08-03 ENCOUNTER — Telehealth: Payer: Self-pay | Admitting: Neurology

## 2016-08-03 DIAGNOSIS — H8110 Benign paroxysmal vertigo, unspecified ear: Secondary | ICD-10-CM

## 2016-08-03 NOTE — Telephone Encounter (Signed)
Discussed vertigo. Don;t think this has to do with rmsf. I tested her for tick-borne illnesses due to her multiple complaints and long-standing issues without etiology but she did not have the symptoms of RMSF and I advised not to focus on this and not to become anxious. She was treated for a +rmsf test. Recommend vestibular therapy and she is going to her ENT later this month. thanks

## 2016-08-03 NOTE — Telephone Encounter (Signed)
Spoke to pt and she has concerns that vertigo and RMS fever related.  She has been to pcp and ED since this has started.  She only took 10 days of doxycycline due to gi upset.  I told her to contact her pcp again.  I would let Dr.  Lucia GaskinsAhern know.

## 2016-08-03 NOTE — Telephone Encounter (Signed)
Patient is calling. Dr. Lucia GaskinsAhern diagnosed her with [facility] on 07-10-16. She was put on Doxycycline and finished medication on 07-22-16. [facility] for Vertigo. She was giving Antivert which she has been taking. She is still dizzy and unable to focus. Please call to discuss and advise.

## 2016-08-10 ENCOUNTER — Encounter: Payer: Self-pay | Admitting: Physical Therapy

## 2016-08-10 ENCOUNTER — Ambulatory Visit: Attending: Neurology | Admitting: Physical Therapy

## 2016-08-10 DIAGNOSIS — H8111 Benign paroxysmal vertigo, right ear: Secondary | ICD-10-CM | POA: Insufficient documentation

## 2016-08-10 DIAGNOSIS — R42 Dizziness and giddiness: Secondary | ICD-10-CM | POA: Diagnosis present

## 2016-08-10 DIAGNOSIS — H8112 Benign paroxysmal vertigo, left ear: Secondary | ICD-10-CM | POA: Insufficient documentation

## 2016-08-10 NOTE — Patient Instructions (Signed)
Tip Card 1.The goal of habituation training is to assist in decreasing symptoms of vertigo, dizziness, or nausea provoked by specific head and body motions. 2.These exercises may initially increase symptoms; however, be persistent and work through symptoms. With repetition and time, the exercises will assist in reducing or eliminating symptoms. 3.Exercises should be stopped and discussed with the therapist if you experience any of the following: - Sudden change or fluctuation in hearing - New onset of ringing in the ears, or increase in current intensity - Any fluid discharge from the ear - Severe pain in neck or back - Extreme nausea   Copyright  VHI. All rights reserved.  Sit to Side-Lying   Sit on edge of bed. Lie down onto the right side and hold until dizziness stops, plus 20 seconds.  Return to sitting and wait until dizziness stops, plus 20 seconds.  Repeat to the left side. Repeat sequence 5 times per session. Do 2 sessions per day.     

## 2016-08-10 NOTE — Therapy (Signed)
[facility] [address] [place], [place], [address] Phone: [phone]   Fax:  [phone]  Physical Therapy Evaluation  Patient Details  Name: Kathryn Riggs MRN: [id number] Date of Birth: [birth year] Referring Provider: Naomie Dean, MD  Encounter Date: 08/10/2016      PT End of Session - 08/10/16 1834    Visit Number 1   Number of Visits 5  eval + 4 visits   Date for PT Re-Evaluation 09/09/16   Authorization Type [employer], [employer]   PT Start Time 1005   PT Stop Time 1101   PT Time Calculation (min) 56 min   Activity Tolerance Other (comment)  Treatment limited by pt declining to allow PT to touch/manually position head or neck   Behavior During Therapy Anxious  required redirection to address vertigo only during this evaluation      Past Medical History:  Diagnosis Date  . Anemia   . Laryngopharyngeal reflux (LPR)   . Migraines   . [facility] spotted fever     History reviewed. No pertinent surgical history.  There were no vitals filed for this visit.       Subjective Assessment - 08/10/16 1006    Subjective "Everything changed in 2013. 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 Reports was diagnosed with [facility] Spotted Fever in July; was prescribed doxycycline x14 days; took only 10 days due to bowel changes. Onset of dizziness 3-5 days after stopping doxycycline.    Pertinent History *Do not touch patient's face or neck, per patient request.  Likes to be called "[name]". PMH significant for: masticatory myalgia, bruxism Achilles tendinosis, insomnia , (+) RMSF, anemia, laryngopharyngeal reflux, DDD C4-C7   Patient Stated Goals "To manage the dizziness."   Currently in Pain? No/denies             [facility] PT Assessment - 08/10/16 0001      Assessment   Medical Diagnosis Benign paroxysmal positional vertigo, unspecified laterality   Referring Provider Naomie Dean, MD   Onset Date/Surgical Date 07/27/16     Precautions   Precautions None     Restrictions   Weight Bearing Restrictions No     Balance Screen   Has the patient fallen in the past 6 months No   Has the patient had a decrease in activity level because of a fear of falling?  No   Is the patient reluctant to leave their home because of a fear of falling?  No     Prior Function   Level of Independence Independent   Vocation Works at home   Leisure likes spinning class, dancing, hiking, sailing and would love to return to these activities     Cognition   Overall Cognitive Status Within Functional Limits for tasks assessed            Vestibular Assessment - 08/10/16 0001      Symptom Behavior   Type of Dizziness Spinning   Frequency of Dizziness daily   Duration of Dizziness seconds to minutes   Aggravating Factors Comment  lying back in dentist chair; worse in seated   Relieving Factors Comments  standing up     Occulomotor Exam   Occulomotor Alignment Normal   Spontaneous Absent   Gaze-induced Absent  Smooth Pursuits Intact   Comment Patient declined Head Thrust Test due to sensitvity to others touching head/neck.     Vestibulo-Occular Reflex   Comment Convergence appears WNL.     Positional Testing   Sidelying Test Sidelying Right;Sidelying Left   Horizontal Canal Testing Horizontal Canal Right;Horizontal Canal Left     Sidelying Right   Sidelying Right Duration 5-8 seconds   Sidelying Right Symptoms Upbeat, right rotatory nystagmus     Sidelying Left   Sidelying Left Duration 12-15 seconds   Sidelying Left Symptoms Upbeat, left rotatory nystagmus     Horizontal Canal Right   Horizontal Canal Right Duration NA  modified to rolling 2/2 pt fear of turning head to endrange    Horizontal Canal Right Symptoms Normal     Horizontal Canal Left   Horizontal Canal Left Duration NA  modified to rolling 2/2 pt fear of turning head to endrange   Horizontal Canal Left Symptoms Normal                Vestibular Treatment/Exercise - 08/10/16 0001      Vestibular Treatment/Exercise   Vestibular Treatment Provided Habituation   Habituation Exercises Gari CrownBrandt Daroff     Brandt Daroff   Number of Reps  5   Symptom Description  Symptoms, nystagmus present on initial trial (bilaterally); no nystagmus, minimal symptoms present on final trial.               PT Education - 08/10/16 1830    Education provided Yes   Education Details Pt eval findings, goals, and POC. Explained nature of BPPV and what to expect after this session.  Explained importance of PT manually positoning head to align semicircular canals, and therefore pt unwillingness to allow PT to touch head/neck will greatly limit treatment effectiveness.    Person(s) Educated Patient   Methods Explanation;Handout;Demonstration;Verbal cues   Comprehension Verbalized understanding;Returned demonstration          PT Short Term Goals - 08/10/16 1848      PT SHORT TERM GOAL #1   Title STG's = LTG's           PT Long Term Goals - 08/10/16 1848      PT LONG TERM GOAL #1   Title Pt will independently perform vestibular HEP to maximize functional gains in therapy.  (Target: 09/07/16)     PT LONG TERM GOAL #2   Title Positional vertigo testing will be negative to indicate resolved BPPV.  (09/07/16)     PT LONG TERM GOAL #3   Title Pt will decrease DHI score from 66 to < / = 38 to indicate significant decrease in pt-perceived disability due to dizziness.  (09/07/16)               Plan - 08/10/16 1837    Clinical Impression Statement Pt is a 45 y/o F referred to outpatient PT to address BPPV.  PMH significant for: masticatory myalgia, bruxism, Achilles tendinosis, insomnia , (+) RMSF,  anemia, laryngopharyngeal reflux, DDD C4-C7 (per patient). Vestibular evaluation/treatment limited by pt declining any assessment/intervention involving touching/manually positioning head or neck, despite education on PT positoning head to align semicircular canals. Limited assessment revealed the following: L upbeating, torsional nystagmus x12-15 seconds L Sidelying Test; R upbeating torsional nystagmus x5-8 seconds on R Sidelying Test. Unable to rule out bilateral posterior canalithasis. Pt declined Dix-Hallpike due to fear of head being extended off EOM and was unable to effectively perform Semont Maneuver without necessary hands-on assist.  Educated pt on [employer] TireBrandt Daroff for habituation. Pt will benefit from vestibular PT 1x/week for up to 4 weeks to address BPPV.    Rehab Potential Fair   Clinical Impairments Affecting Rehab Potential patient declined all vestibular assessments/interventions involving cervical spine extension or touch/manual positioning   PT Frequency 1x / week   PT Duration 4 weeks   PT Treatment/Interventions ADLs/Self Care Home Management;Patient/family education;Neuromuscular re-education;Therapeutic exercise;Balance training;Therapeutic activities;Functional mobility training;Stair training;Gait training;Canalith Repostioning;Vestibular   PT Next Visit Plan Assess HEP performance. If symptoms resolved, DC. Consider SOT if symptoms persist after BPPV cleared.   Consulted and Agree with Plan of Care Patient      Patient will benefit from skilled therapeutic intervention in order to improve the following deficits and impairments:  Dizziness  Visit Diagnosis: BPPV (benign paroxysmal positional vertigo), right - Plan: PT plan of care cert/re-cert  BPPV (benign paroxysmal positional vertigo), left - Plan: PT plan of care cert/re-cert  Dizziness and giddiness - Plan: PT plan of care cert/re-cert     Problem List Patient Active Problem List   Diagnosis Date Noted  . Insomnia  05/14/2016  . 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 Muscle pain 04/30/2016  . Achilles tendinosis 04/30/2016    Jorje GuildBlair Hobble, PT, DPT [facility] [address] [place], [place], [address] Phone: [phone]   Fax:  [phone] 08/10/16, 6:54 PM  Name: Kathryn Riggs MRN: [id number] Date of Birth: Aug 13, 1971

## 2016-08-13 ENCOUNTER — Ambulatory Visit: Admitting: Physical Therapy

## 2016-08-13 DIAGNOSIS — H8112 Benign paroxysmal vertigo, left ear: Secondary | ICD-10-CM

## 2016-08-13 DIAGNOSIS — H8111 Benign paroxysmal vertigo, right ear: Secondary | ICD-10-CM | POA: Diagnosis not present

## 2016-08-13 DIAGNOSIS — R42 Dizziness and giddiness: Secondary | ICD-10-CM

## 2016-08-13 NOTE — Therapy (Signed)
[facility] [address] [place], [place], [address] Phone: [phone]   Fax:  [phone]  Physical Therapy Treatment  Patient Details  Name: Kathryn GarnetLizbeth Riggs MRN: [id number] Date of Birth: June 02, 1971 Referring Provider: Naomie DeanAntonia Ahern, MD  Encounter Date: 08/13/2016      PT End of Session - 08/13/16 1342    Visit Number 2   Number of Visits 5   Date for PT Re-Evaluation 09/09/16   Authorization Type [employer], [employer]   PT Start Time 1016   PT Stop Time 1100   PT Time Calculation (min) 44 min   Activity Tolerance Other (comment)  Somewhat limited by fear of fast movement   Behavior During Therapy Anxious      Past Medical History:  Diagnosis Date  . Anemia   . Laryngopharyngeal reflux (LPR)   . Migraines   . [facility]     No past surgical history on file.  There were no vitals filed for this visit.      Subjective Assessment - 08/13/16 1019    Subjective "I felt pretty good yesterday...but I woke up this morning and felt the dizziness in bed...moreso since I've been here." Did home exercises on Friday and Saturday but not yesterday due to time constraint.    Pertinent History *Do not touch patient's face or neck, per patient request.  Likes to be called "Kathryn IvanLiz". PMH significant for: masticatory myalgia, bruxism Achilles tendinosis, insomnia , (+) RMSF, anemia, laryngopharyngeal reflux, DDD C4-C7   Patient Stated Goals "To manage the dizziness."   Currently in Pain? No/denies                Vestibular Assessment - 08/13/16 0001      Positional Testing   Sidelying Test Sidelying Right;Sidelying Left     Sidelying Right   Sidelying Right Duration N/A   Sidelying Right Symptoms No nystagmus     Sidelying Left   Sidelying Left Duration >30 seconds (most visible when gaze in direction of nystagmus)   Sidelying Left Symptoms Upbeat, left rotatory nystagmus     Positional  Sensitivities   Positional Sensitivities Comments 2/5 dizziness with transition from R sidelying > sit; 1/5 symptoms ("I feel unstable in my neck," per patient).                  Vestibular Treatment/Exercise - 08/13/16 0001      Vestibular Treatment/Exercise   Vestibular Treatment Provided Canalith Repositioning;Habituation   Canalith Repositioning Semont Procedure Left Posterior   Habituation Exercises Austin MilesBrandt Daroff     Semont Procedure Left Posterior   Number of Reps  2   Overall Response Symptoms Worsened   Response Details  Per pt request, PT first demonstrated maneuver slowly, then took patient through maneuver slowly. Then, performed manuever on patient (with pt's verbal permission for PT to touch patient's head for positioning); however, maneuver slowed due to pt fear of quick movement. Finally, pt performed maneuver with verbal cueing (no hands-on assist) from PT with improved speed but poor head positioning during second position.     Austin MilesBrandt Daroff   Number of Reps  5   Symptom Description  Consistently required cueing for technique (45 degrees of rotation). Very difficult to discern if symptoms improved within 5 reps due to pt vague description of symptoms.               PT Education - 08/13/16 1339    Education provided Yes   Education  Details Per pt inquiry as to whether dizziness could be related to neck dysfunction, explained that this is possible but that PT will first treat BPPV then rule out symptoms originating rom cervical spine.  Explained importance of patient allowing PT to maintain head position during Semont Maneuver to ensure proper head positioning. Explained that per current evidence, the Semont Maneuver is the most effective repositioning maneuver for PC cupulolithiasis.    Person(s) Educated Patient   Methods Explanation   Comprehension Verbalized understanding          PT Short Term Goals - 08/10/16 1848      PT SHORT TERM GOAL #1    Title STG's = LTG's           PT Long Term Goals - 08/10/16 1848      PT LONG TERM GOAL #1   Title Pt will independently perform vestibular HEP to maximize functional gains in therapy.  (Target: 09/07/16)     PT LONG TERM GOAL #2   Title Positional vertigo testing will be negative to indicate resolved BPPV.  (09/07/16)     PT LONG TERM GOAL #3   Title Pt will decrease DHI score from 66 to < / = 38 to indicate significant decrease in pt-perceived disability due to dizziness.  (09/07/16)               Plan - 08/13/16 1350    Clinical Impression Statement R Dix-Hallpike (-) and asymptomatic; but L Dix-Hallpike with >30 seconds of L upbeating, torsional nystagmus accompanied by symptoms. With education, pt more amenable to allowing PT to maintain head position during today's treatment. After PT explained and demonstrated L PC Semont Maneuver, pt allowed PT to perform x1; however, velocity very slow (due to pt anxiety) and therefore, unsure if effective. Pt instructed to continue Austin MilesBrandt Daroff for habituation at home.    Rehab Potential Fair   Clinical Impairments Affecting Rehab Potential patient declined all vestibular assessments/interventions involving cervical spine extension or touch/manual positioning   PT Frequency 1x / week   PT Duration 4 weeks   PT Treatment/Interventions ADLs/Self Care Home Management;Patient/family education;Neuromuscular re-education;Therapeutic exercise;Balance training;Therapeutic activities;Functional mobility training;Stair training;Gait training;Canalith Repostioning;Vestibular   PT Next Visit Plan Assess for BPPV (L PC) and treat prn.    Consulted and Agree with Plan of Care Patient      Patient will benefit from skilled therapeutic intervention in order to improve the following deficits and impairments:  Dizziness  Visit Diagnosis: BPPV (benign paroxysmal positional vertigo), left  Dizziness and giddiness     Problem List Patient Active  Problem List   Diagnosis Date Noted  . Insomnia 05/14/2016  . 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 Muscle pain 04/30/2016  . Achilles tendinosis 04/30/2016    Jorje GuildBlair Hobble, PT, DPT [facility] [address] [place], [place], [address] Phone: [phone]   Fax:  [phone] 08/13/16, 1:54 PM  Name: Kathryn GarnetLizbeth Gumbs MRN: [id number] Date of Birth: 08/03/1971

## 2016-08-16 NOTE — Telephone Encounter (Signed)
Dizziness, vertigo not due to cervical pain. She should go and be seen by her primary care. Any concerns not already addressed need to be seen by primary care first and they have to send her here if they feel neurologic workup is warranted.  thanks

## 2016-08-16 NOTE — Telephone Encounter (Signed)
Patient called to advise she has been doing vestibular therapy, "I don't know if it's helping", wonders if it's something coming from neck? States she does have herniated disc confirmed in 2014, has noticed for the last 4-6 weeks, vertebrae very tender spot, could this be associated with this? Please call [phone].

## 2016-08-17 ENCOUNTER — Ambulatory Visit: Payer: PRIVATE HEALTH INSURANCE

## 2016-08-17 NOTE — Telephone Encounter (Addendum)
LFt vm for patient that therapy for dizzy spells is not causing the neck pain. Rn advise patient on vm to call her PCP for her neck pain. Rn left on vm to call back on Monday if she had any questions.

## 2016-08-19 ENCOUNTER — Encounter (HOSPITAL_COMMUNITY): Payer: Self-pay | Admitting: Emergency Medicine

## 2016-08-19 ENCOUNTER — Emergency Department (HOSPITAL_COMMUNITY)
Admission: EM | Admit: 2016-08-19 | Discharge: 2016-08-19 | Disposition: A | Discharge status: Home / Self Care | Attending: Emergency Medicine | Admitting: Emergency Medicine

## 2016-08-19 DIAGNOSIS — Z79899 Other long term (current) drug therapy: Secondary | ICD-10-CM | POA: Insufficient documentation

## 2016-08-19 DIAGNOSIS — R251 Tremor, unspecified: Secondary | ICD-10-CM | POA: Diagnosis present

## 2016-08-19 LAB — COMPREHENSIVE METABOLIC PANEL
ALBUMIN: 4.9 g/dL (ref 3.5–5.0)
ALT: 17 U/L (ref 14–54)
ANION GAP: 7 (ref 5–15)
AST: 20 U/L (ref 15–41)
Alkaline Phosphatase: 39 U/L (ref 38–126)
BILIRUBIN TOTAL: 0.4 mg/dL (ref 0.3–1.2)
BUN: 9 mg/dL (ref 6–20)
CO2: 25 mmol/L (ref 22–32)
Calcium: 9.3 mg/dL (ref 8.9–10.3)
Chloride: 105 mmol/L (ref 101–111)
Creatinine, Ser: 0.75 mg/dL (ref 0.44–1.00)
GFR calc Af Amer: >60 mL/min (ref >60)
GFR calc non Af Amer: >60 mL/min (ref >60)
GLUCOSE: 88 mg/dL (ref 65–99)
POTASSIUM: 3.7 mmol/L (ref 3.5–5.1)
SODIUM: 137 mmol/L (ref 135–145)
TOTAL PROTEIN: 7.7 g/dL (ref 6.5–8.1)

## 2016-08-19 LAB — CBC
HEMATOCRIT: 39.4 % (ref 36.0–46.0)
HEMOGLOBIN: 13.3 g/dL (ref 12.0–15.0)
MCH: 30 pg (ref 26.0–34.0)
MCHC: 33.8 g/dL (ref 30.0–36.0)
MCV: 88.7 fL (ref 78.0–100.0)
Platelets: 239 10*3/uL (ref 150–400)
RBC: 4.44 MIL/uL (ref 3.87–5.11)
RDW: 12.3 % (ref 11.5–15.5)
WBC: 5.3 10*3/uL (ref 4.0–10.5)

## 2016-08-19 NOTE — ED Notes (Signed)
Patient states she has been experiencing "discomfort and shakiness of the neck and upper back. Currently denies pain but states "someting just feels strange"

## 2016-08-19 NOTE — ED Notes (Signed)
RN at bedside collecting labs 

## 2016-08-19 NOTE — ED Notes (Signed)
Pt ambulatory and independent at discharge.  Verbalized understanding of discharge instructions 

## 2016-08-19 NOTE — ED Triage Notes (Signed)
Pt c/o new onset head and neck shaking x few days.   Pt reports vertigo onset 07/27/16, seen in ED on 07/30/16 for the same, sent to PCP who diagnosed her with neck tightness. Pt's neurologist sent pt to vestibular PT.  [facility], was treated with doxycycline but only took 10/14 days of therapy due to GI symptoms.

## 2016-08-19 NOTE — ED Provider Notes (Signed)
[facility]-EMERGENCY DEPT Provider Note   CSN: [id number] Arrival date & time: 08/19/16  1711     History   Chief Complaint Chief Complaint  Patient presents with  . Other    Head and Neck Shaking    HPI Kathryn BanningLizbeth Ciampa is a 45 y.o. female.  45 year old female presents with 2 day history of intermittent shaking to her right arm characterizes a twitch. Denies any severe headaches or seizure activity. States that symptoms wax and wane and nothing seems to bring them out or make them better. Denies any prior history of same. No recent medication use. Just completed a course of doxycycline for walking Monospot a fever. No recent fevers. Denies any rashes. No treatment use prior to arrival. Called her doctor and told to come in for further evaluation      Past Medical History:  Diagnosis Date  . Anemia   . Laryngopharyngeal reflux (LPR)   . Migraines   . [place] spotted fever     Patient Active Problem List   Diagnosis Date Noted  . Insomnia 05/14/2016  . 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 Muscle pain 04/30/2016  . Achilles tendinosis 04/30/2016    History reviewed. No pertinent surgical history.  OB History    No data available       Home Medications    Prior to Admission medications   Medication Sig Start Date End Date Taking? Authorizing Provider  Cholecalciferol (VITAMIN D3) 1000 units CHEW Chew 1 tablet by mouth daily.    Historical Provider, MD  doxycycline (VIBRAMYCIN) 100 MG capsule Take 1 capsule (100 mg total) by mouth 2 (two) times daily. 07/05/16   Anson FretAntonia B Ahern, MD  DULoxetine (CYMBALTA) 30 MG capsule Take 1 capsule (30 mg total) by mouth daily. Patient not taking: Reported on 08/10/2016 07/03/16   Anson FretAntonia B Ahern, MD  meclizine (ANTIVERT) 25 MG tablet Take 1 tablet (25 mg total) by mouth 3 (three) times daily as needed for dizziness. 07/30/16   Loren Raceravid Yelverton, MD  Multiple Vitamins-Minerals (MULTIVITAMIN ADULT)  CHEW Chew 1 tablet by mouth daily.    Historical Provider, MD  Probiotic Product (PROBIOTIC-10) CHEW Chew 1 tablet by mouth daily.    Historical Provider, MD    Family History Family History  Problem Relation Age of Onset  . Stroke Neg Hx   . Neuropathy Neg Hx   . Multiple sclerosis Neg Hx   . Migraines Neg Hx     Social History Social History  Substance Use Topics  . Smoking status: Never Smoker  . Smokeless tobacco: Never Used  . Alcohol use No     Allergies   Review of patient's allergies indicates no known allergies.   Review of Systems Review of Systems  All other systems reviewed and are negative.    Physical Exam Updated Vital Signs BP 134/90 (BP Location: Left Arm)   Pulse 78   Temp 98.3 F (36.8 C) (Oral)   Resp 18   LMP 07/29/2016 (Exact Date)   SpO2 100%   Physical Exam  Constitutional: She is oriented to person, place, and time. She appears well-developed and well-nourished.  Non-toxic appearance. No distress.  HENT:  Head: Normocephalic and atraumatic.  Eyes: Conjunctivae, EOM and lids are normal. Pupils are equal, round, and reactive to light.  Neck: Normal range of motion. Neck supple. No tracheal deviation present. No thyroid mass present.  Cardiovascular: Normal rate, regular rhythm and normal heart sounds.  Exam reveals no gallop.  No murmur heard. Pulmonary/Chest: Effort normal and breath sounds normal. No stridor. No respiratory distress. She has no decreased breath sounds. She has no wheezes. She has no rhonchi. She has no rales.  Abdominal: Soft. Normal appearance and bowel sounds are normal. She exhibits no distension. There is no tenderness. There is no rebound and no CVA tenderness.  Musculoskeletal: Normal range of motion. She exhibits no edema or tenderness.  Neurological: She is alert and oriented to person, place, and time. She has normal strength. No cranial nerve deficit or sensory deficit. GCS eye subscore is 4. GCS verbal subscore  is 5. GCS motor subscore is 6.  Skin: Skin is warm and dry. No abrasion and no rash noted.  Psychiatric: She has a normal mood and affect. Her speech is normal and behavior is normal.  Nursing note and vitals reviewed.    ED Treatments / Results  Labs (all labs ordered are listed, but only abnormal results are displayed) Labs Reviewed  COMPREHENSIVE METABOLIC PANEL  CBC    EKG  EKG Interpretation None       Radiology No results found.  Procedures Procedures (including critical care time)  Medications Ordered in ED Medications - No data to display   Initial Impression / Assessment and Plan / ED Course  I have reviewed the triage vital signs and the nursing notes.  Pertinent labs & imaging results that were available during my care of the patient were reviewed by me and considered in my medical decision making (see chart for details).  Clinical Course    Patient's labs are reassuring. She has no focal deficits on neurological exam. Encouraged to follow-up with her neurologist.  Final Clinical Impressions(s) / ED Diagnoses   Final diagnoses:  None    New Prescriptions New Prescriptions   No medications on file     Lorre Nick, MD 08/19/16 1947

## 2016-08-20 NOTE — Telephone Encounter (Deleted)
fff

## 2016-08-20 NOTE — Telephone Encounter (Signed)
Dr. Lucia GaskinsAhern has reviewed her chart and recommended her follow up w/ PCP.  Patient agreeable to this plan and will schedule an appt.

## 2016-08-20 NOTE — Telephone Encounter (Signed)
Patient called to advise, she went to ER yesterday, was advised to follow up with Neurologist, mild tremors, shoulder, neck into hand, states she has had jaw tremor in the past, has been homebound for past 3 1/2 weeks due to vertigo. While standing at church singing yesterday, she noticed herself holding her hand due to her hand shaking. States she also has a headache. Was given information in ER regarding Spasticity.

## 2016-08-22 ENCOUNTER — Ambulatory Visit: Admitting: Rehabilitative and Restorative Service Providers"

## 2016-08-22 DIAGNOSIS — H8111 Benign paroxysmal vertigo, right ear: Secondary | ICD-10-CM | POA: Diagnosis not present

## 2016-08-22 DIAGNOSIS — H8112 Benign paroxysmal vertigo, left ear: Secondary | ICD-10-CM

## 2016-08-22 DIAGNOSIS — R42 Dizziness and giddiness: Secondary | ICD-10-CM

## 2016-08-22 NOTE — Patient Instructions (Signed)
Sit to Side-Lying   Sit on edge of bed. Lie down onto the right side and hold until dizziness stops, plus 20 seconds.  Return to sitting and wait until dizziness stops, plus 20 seconds.  Repeat to the left side. Repeat sequence 5 times per session. Do 2 sessions per day.  Copyright  VHI. All rights reserved.  Gaze Stabilization: Tip Card 1.Target must remain in focus, not blurry, and appear stationary while head is in motion. 2.Perform exercises with small head movements (45 to either side of midline). 3.Increase speed of head motion so long as target is in focus. 4.If you wear eyeglasses, be sure you can see target through lens (therapist will give specific instructions for bifocal / progressive lenses). 5.These exercises may provoke dizziness or nausea. Work through these symptoms. If too dizzy, slow head movement slightly. Rest between each exercise. 6.Exercises demand concentration; avoid distractions. 7.For safety, perform standing exercises close to a counter, wall, corner, or next to someone.  Copyright  VHI. All rights reserved.  Gaze Stabilization: Standing Feet Apart   BEGIN SEATED 3 FEET FROM TARGET (PLACED SLIGHTLY BELOW EYE LEVEL) Feet shoulder width apart, keeping eyes on target on wall 3 feet away, tilt head down slightly and move head side to side for 30 seconds. Repeat while moving head up and down for 30 seconds. Do 2 sessions per day.  *This should not increase neck pain.  Copyright  VHI. All rights reserved.

## 2016-08-22 NOTE — Therapy (Signed)
[facility] [address] [place], [place], [address] Phone: [phone]   Fax:  [phone]  Physical Therapy Treatment  Patient Details  Name: Letonia Stead MRN: [id number] Date of Birth: Nov 04, 1971 Referring Provider: Naomie Dean, MD  Encounter Date: 08/22/2016      PT End of Session - 08/22/16 1132    Visit Number 3   Number of Visits 5   Date for PT Re-Evaluation 09/09/16   Authorization Type [employer], [employer]   PT Start Time 0935   PT Stop Time 1015   PT Time Calculation (min) 40 min   Activity Tolerance Other (comment)  Patient limited by significantly guarded neck position   Behavior During Therapy Anxious      Past Medical History:  Diagnosis Date  . Anemia   . Laryngopharyngeal reflux (LPR)   . Migraines   . [facility] spotted fever     No past surgical history on file.  There were no vitals filed for this visit.      Subjective Assessment - 08/22/16 0935    Subjective The pateint reports that she had soreness in her neck after last treatment session.  She saw a massage therapist and felt improvement in soreness, but did notice tremors began in neck and shoulder and hands.  She ended up going to the ER.   She saw ENT yesterday and no inner ear issues per MD.  Dizziness is worse with bending forward and some at baseline due to turning to look at PT as she is talking.    Pertinent History *Do not touch patient's face or neck, per patient request.  Likes to be called "[name]". PMH significant for: masticatory myalgia, bruxism Achilles tendinosis, insomnia , (+) RMSF, anemia, laryngopharyngeal reflux, DDD C4-C7   Patient Stated Goals "To manage the dizziness."   Currently in Pain? --  patient guarded in neck--PT monitored t/o session                Vestibular Assessment - 08/22/16 0951      Vestibular Assessment   General Observation The patient notes that she has difficulty bending  forward.      Positional Testing   Sidelying Test Sidelying Right;Sidelying Left   Horizontal Canal Testing Horizontal Canal Right;Horizontal Canal Left     Sidelying Right   Sidelying Right Duration mild nystagmus noted that appeared to be R beating x 5 beats.  Mild dizziness reported.   Sidelying Right Symptoms Right nystagmus  sensation of 8/10 dizziness with return to sit     Sidelying Left   Sidelying Left Duration no dizziness   Sidelying Left Symptoms No nystagmus  4-5/10 returning to sitting     Horizontal Canal Right   Horizontal Canal Right Duration none   Horizontal Canal Right Symptoms Normal     Horizontal Canal Left   Horizontal Canal Left Duration none   Horizontal Canal Left Symptoms Normal                 [facility] Adult PT Treatment/Exercise - 08/22/16 1136      Self-Care   Self-Care Other Self-Care Comments   Other Self-Care Comments  PT and patient discussed these topics during session:  1) HEP performance (was unable to do over weekend due to onset of tremor in her hands/neck)--recommended return to HEP and educated in gaze x 1 viewing.  2) Patient and PT discussed need to increase overall mobility to strengthen vestibular system.  She expressed limitations began  in 2013 with labral tear of hip and neck dysfunction.  PT recommended she try to increase activity within tolerable range as this will improve the capacity of her vestibular system.  3) The patient moves en bloc and PT inquired about prior therapy for neck.  It has been years since PT for neck.  She is concerned that a pathology is present or has gotten worse since last MRI.  PT discussed that there are no "red flag" items at this time to indicate she should not move her head and this is contributing to dec'd vestibular function.  Encouraged gentle ROM within tolerable ROM and f/u with MD for concerns re: neck pathology.  4) Patient also expresses core of limited mobility related to perceived leg  length difference and that she has had 2 professionals disagree over whether or not she has a leg length discrepancy.  I recommended she discuss with other PT (being seen at private ortho clinic) as our plan of care is focused on vertigo and vestibular rehab.          Vestibular Treatment/Exercise - 08/22/16 1133      Vestibular Treatment/Exercise   Vestibular Treatment Provided Gaze;Habituation   Habituation Exercises Austin Miles   Gaze Exercises X1 Viewing Horizontal;X1 Viewing Vertical     Austin Miles   Symptom Description  Verbally discussed and recommended patient perform.  Educated patient that nystagmus appear improved over last session and purpose of brandt daroff is to lessen sensitivity to motion through repetition.     X1 Viewing Horizontal   Foot Position seated   Comments Target x 3 feet away with cues on slow/gentle neck motion to begin.  Patient has difficulty maintaining fixation on target and needs cues for technique.  Patient tolerated without increased neck pain in session.     X1 Viewing Vertical   Foot Position seated   Comments Demonstrated visual motion for home.  She is more limited in vertical ROM per observation and again it was emphasized to remain in tolerable ROM.                PT Education - 08/22/16 1131    Education provided Yes   Education Details HEP: added gaze x 1 viewing seated emphasizing gentle ROM within tolerable ROM with visual fixation.   Person(s) Educated Patient   Methods Explanation;Demonstration;Handout   Comprehension Verbalized understanding;Returned demonstration          PT Short Term Goals - 08/10/16 1848      PT SHORT TERM GOAL #1   Title STG's = LTG's           PT Long Term Goals - 08/10/16 1848      PT LONG TERM GOAL #1   Title Pt will independently perform vestibular HEP to maximize functional gains in therapy.  (Target: 09/07/16)     PT LONG TERM GOAL #2   Title Positional vertigo testing will be  negative to indicate resolved BPPV.  (09/07/16)     PT LONG TERM GOAL #3   Title Pt will decrease DHI score from 66 to < / = 38 to indicate significant decrease in pt-perceived disability due to dizziness.  (09/07/16)               Plan - 08/22/16 1142    Clinical Impression Statement The patient had only trace nystagmus noted today with R sidelying test.  Other positional testing WNLs.  She had subjective report of dizziness with return from sidelying to  sitting (worse on R than L).  PT encouraged continuation of habituation exercises and added gaze with emphasis on small head movement and performing within tolerable range for neck.  PT emphasized movement in daily activities.    Rehab Potential Fair   PT Treatment/Interventions ADLs/Self Care Home Management;Patient/family education;Neuromuscular re-education;Therapeutic exercise;Balance training;Therapeutic activities;Functional mobility training;Stair training;Gait training;Canalith Repostioning;Vestibular   PT Next Visit Plan Assess vertigo as needed, progress habituation, progress gaze activities, encourage community exercise for improved motion tolerance.   Consulted and Agree with Plan of Care Patient      Patient will benefit from skilled therapeutic intervention in order to improve the following deficits and impairments:  Dizziness  Visit Diagnosis: BPPV (benign paroxysmal positional vertigo), left  Dizziness and giddiness  BPPV (benign paroxysmal positional vertigo), right     Problem List Patient Active Problem List   Diagnosis Date Noted  . Insomnia 05/14/2016  . 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 Muscle pain 04/30/2016  . Achilles tendinosis 04/30/2016    Sereena Marando , PT 08/22/2016, 11:47 AM  [facility] [address] [place], [place], [address] Phone: [phone]   Fax:  [phone]  Name: Eugene GarnetLizbeth  Bazzi MRN: [id number] Date of Birth: 31-Oct-1971

## 2016-08-23 ENCOUNTER — Other Ambulatory Visit: Payer: Self-pay | Admitting: Internal Medicine

## 2016-08-23 DIAGNOSIS — M5020 Other cervical disc displacement, unspecified cervical region: Secondary | ICD-10-CM

## 2016-08-24 ENCOUNTER — Encounter: Payer: PRIVATE HEALTH INSURANCE | Admitting: Physical Therapy

## 2016-08-28 ENCOUNTER — Ambulatory Visit
Admission: RE | Admit: 2016-08-28 | Discharge: 2016-08-28 | Disposition: A | Discharge status: Home / Self Care | Source: Ambulatory Visit | Attending: Internal Medicine | Admitting: Internal Medicine

## 2016-08-28 DIAGNOSIS — M5020 Other cervical disc displacement, unspecified cervical region: Secondary | ICD-10-CM

## 2016-08-29 ENCOUNTER — Emergency Department (HOSPITAL_COMMUNITY)
Admission: EM | Admit: 2016-08-29 | Discharge: 2016-08-29 | Disposition: A | Discharge status: Home / Self Care | Attending: Emergency Medicine | Admitting: Emergency Medicine

## 2016-08-29 ENCOUNTER — Encounter (HOSPITAL_COMMUNITY): Payer: Self-pay | Admitting: Emergency Medicine

## 2016-08-29 ENCOUNTER — Telehealth: Payer: Self-pay | Admitting: Neurology

## 2016-08-29 ENCOUNTER — Ambulatory Visit: Admitting: Rehabilitative and Restorative Service Providers"

## 2016-08-29 DIAGNOSIS — G8929 Other chronic pain: Secondary | ICD-10-CM | POA: Diagnosis not present

## 2016-08-29 DIAGNOSIS — Z7982 Long term (current) use of aspirin: Secondary | ICD-10-CM | POA: Diagnosis not present

## 2016-08-29 DIAGNOSIS — Z79899 Other long term (current) drug therapy: Secondary | ICD-10-CM | POA: Insufficient documentation

## 2016-08-29 DIAGNOSIS — R519 Headache, unspecified: Secondary | ICD-10-CM

## 2016-08-29 DIAGNOSIS — R51 Headache: Secondary | ICD-10-CM | POA: Diagnosis present

## 2016-08-29 DIAGNOSIS — F419 Anxiety disorder, unspecified: Secondary | ICD-10-CM | POA: Insufficient documentation

## 2016-08-29 DIAGNOSIS — R42 Dizziness and giddiness: Secondary | ICD-10-CM | POA: Insufficient documentation

## 2016-08-29 LAB — PROTEIN, CSF: TOTAL PROTEIN, CSF: 40 mg/dL (ref 15–45)

## 2016-08-29 LAB — CSF CELL COUNT WITH DIFFERENTIAL
RBC COUNT CSF: 1 /mm3 — AB
RBC COUNT CSF: 11 /mm3 — AB
TUBE #: 1
TUBE #: 4
WBC CSF: 4 /mm3 (ref 0–5)
WBC, CSF: 3 /mm3 (ref 0–5)

## 2016-08-29 LAB — GLUCOSE, CSF: GLUCOSE CSF: 54 mg/dL (ref 40–70)

## 2016-08-29 MED ORDER — LIDOCAINE HCL 1 % IJ SOLN
INTRAMUSCULAR | Status: AC
  Administered 2016-08-29: 10 mL
  Filled 2016-08-29: qty 20

## 2016-08-29 MED ORDER — LORAZEPAM 1 MG PO TABS
1.0000 mg | ORAL_TABLET | Freq: Once | ORAL | Status: AC
  Administered 2016-08-29: 1 mg via ORAL
  Filled 2016-08-29: qty 1

## 2016-08-29 MED ORDER — LIDOCAINE HCL 1 % IJ SOLN
10.0000 mL | Freq: Once | INTRAMUSCULAR | Status: AC
  Administered 2016-08-29: 10 mL

## 2016-08-29 NOTE — ED Provider Notes (Signed)
[facility]-EMERGENCY DEPT Provider Note   CSN: [id number] Arrival date & time: 08/29/16  4098     History   Chief Complaint Chief Complaint  Patient presents with  . Headache  . Dizziness    HPI Kathryn Riggs is a 45 y.o. female.  HPI Pt started having a headache over the last month.  She feels the headache in her temples, jaw and teeth.  She has vertigo with it that is improving.  She tried to contact her doctor yesterday but has not heard back.  Pt has been treated in the past for lymes.  She is concerned that her symptoms are escalating and could be related. She had western blot testing recently and was told that it was positive for the IGG.    The headache is getting worse.  She feels like it is inflamed.  The HA is now very severe primarily in the back of her neck and head.  Patient has done some research on the Internet. Thinks her symptoms may be related to chronic Lyme's disease. She also is concerned about the possibility of a Bartonella infection. She has read about of an area in the [place] area that has been doing some research on vector born illnesses. Past Medical History:  Diagnosis Date  . Anemia   . Laryngopharyngeal reflux (LPR)   . Migraines   . Rocky [place] spotted fever     Patient Active Problem List   Diagnosis Date Noted  . Insomnia 05/14/2016  . 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 Muscle pain 04/30/2016  . Achilles tendinosis 04/30/2016    History reviewed. No pertinent surgical history.  OB History    No data available       Home Medications    Prior to Admission medications   Medication Sig Start Date End Date Taking? Authorizing Provider  aspirin-acetaminophen-caffeine (EXCEDRIN MIGRAINE) [phone] MG tablet Take 1 tablet by mouth every 6 (six) hours as needed for headache.   Yes Historical Provider, MD  Cholecalciferol (VITAMIN D3) 1000 units CHEW Chew 1 tablet by mouth daily.   Yes Historical  Provider, MD  doxycycline (VIBRA-TABS) 100 MG tablet Take 100 mg by mouth 2 (two) times daily.   Yes Historical Provider, MD  ferrous gluconate (FERGON) 324 MG tablet Take 324 mg by mouth daily with breakfast.   Yes Historical Provider, MD  meclizine (ANTIVERT) 25 MG tablet Take 1 tablet (25 mg total) by mouth 3 (three) times daily as needed for dizziness. 07/30/16  Yes Loren Racer, MD  Multiple Vitamins-Minerals (MULTIVITAMIN ADULT) CHEW Chew 1 tablet by mouth daily.   Yes Historical Provider, MD  Omega Fatty Acids-Vitamins (OMEGA-3 GUMMIES PO) Take 1 tablet by mouth daily.   Yes Historical Provider, MD  Probiotic Product (PROBIOTIC-10) CHEW Chew 1 tablet by mouth daily.   Yes Historical Provider, MD  DULoxetine (CYMBALTA) 30 MG capsule Take 1 capsule (30 mg total) by mouth daily. Patient not taking: Reported on 08/10/2016 07/03/16   Anson Fret, MD    Family History Family History  Problem Relation Age of Onset  . Stroke Neg Hx   . Neuropathy Neg Hx   . Multiple sclerosis Neg Hx   . Migraines Neg Hx     Social History Social History  Substance Use Topics  . Smoking status: Never Smoker  . Smokeless tobacco: Never Used  . Alcohol use No     Allergies   Review of patient's allergies indicates no known allergies.  Review of Systems Review of Systems  Constitutional: Positive for fatigue. Negative for fever.  HENT: Negative for sneezing and sore throat.   Eyes: Negative for photophobia.  Respiratory: Negative for shortness of breath.   Cardiovascular: Negative for chest pain.  Gastrointestinal: Negative for abdominal pain.  Genitourinary: Negative for dysuria.  Neurological: Positive for light-headedness and headaches. Negative for seizures, syncope and speech difficulty.  Psychiatric/Behavioral:       Anxious about her medical problems  All other systems reviewed and are negative.    Physical Exam Updated Vital Signs BP 126/75 (BP Location: Right Arm)   Pulse  64   Temp 98.6 F (37 C) (Oral)   Resp 16   Ht 5\' 7"  (1.702 m)   Wt 76.2 kg   LMP 07/29/2016 (Exact Date)   SpO2 100%   BMI 26.31 kg/m   Physical Exam  Constitutional: She appears well-developed and well-nourished. No distress.  HENT:  Head: Normocephalic and atraumatic.  Right Ear: External ear normal.  Left Ear: External ear normal.  Eyes: Conjunctivae are normal. Right eye exhibits no discharge. Left eye exhibits no discharge. No scleral icterus.  Neck: Neck supple. No tracheal deviation present.  ttp posterior aspect proximal neck  Cardiovascular: Normal rate, regular rhythm and intact distal pulses.   Pulmonary/Chest: Effort normal and breath sounds normal. No stridor. No respiratory distress. She has no wheezes. She has no rales.  Abdominal: Soft. Bowel sounds are normal. She exhibits no distension. There is no tenderness. There is no rebound and no guarding.  Musculoskeletal: She exhibits no edema or tenderness.  Neurological: She is alert. She has normal strength. No cranial nerve deficit (no facial droop, extraocular movements intact, no slurred speech) or sensory deficit. She exhibits normal muscle tone. She displays no seizure activity. Coordination normal.  Skin: Skin is warm and dry. No rash noted.  Psychiatric: Her mood appears anxious.  Nursing note and vitals reviewed.    ED Treatments / Results  Labs (all labs ordered are listed, but only abnormal results are displayed) Labs Reviewed  CSF CELL COUNT WITH DIFFERENTIAL - Abnormal; Notable for the following:       Result Value   RBC Count, CSF 11 (*)    All other components within normal limits  CSF CELL COUNT WITH DIFFERENTIAL - Abnormal; Notable for the following:    RBC Count, CSF 1 (*)    All other components within normal limits  CSF CULTURE  GLUCOSE, CSF  PROTEIN, CSF  ARBOVIRUS IGG, CSF  B. BURGDORFI ANTIBODIES, CSF    EKG  EKG Interpretation None       Radiology Mr Cervical Spine Wo  Contrast  Result Date: 08/28/2016 CLINICAL DATA:  Neck pain extending of both arms. Intermittent arm numbness, vertigo, dizziness, posterior headaches. Symptoms began 1 month ago. EXAM: MRI CERVICAL SPINE WITHOUT CONTRAST TECHNIQUE: Multiplanar, multisequence MR imaging of the cervical spine was performed. No intravenous contrast was administered. COMPARISON:  MRI brain 02/28/2016 FINDINGS: Alignment: There is straightening and some reversal of the normal cervical lordosis. AP alignment is anatomic. Vertebrae: Mild endplate marrow changes are present on the right at C5-6. Vertebral body heights are maintained. Marrow signal is otherwise within normal limits. Cord: Normal signal is present in the cervical and upper thoracic spinal cord to the lowest imaged level, T2-3. Posterior Fossa, vertebral arteries, paraspinal tissues: The craniocervical junction is within normal limits. The visualized intracranial contents are normal. Flow is present in the vertebral arteries bilaterally. Disc levels: C2-3: Negative.  C3-4: A leftward disc osteophyte complex is present. Uncovertebral spurring is present on the left without significant stenosis. C4-5: A central disc protrusion partially effaces the ventral CSF. Uncovertebral spurring is worse on the left. The foramina are patent. C5-6: A broad-based disc osteophyte complex is asymmetric to the right. There is effacement of the ventral CSF on the right with slight distortion of the ventral surface of the cord on the right. No abnormal cord signal is present. Uncovertebral spurring is worse on the left. The foramina are patent. C6-7: A shallow central disc protrusion partially effaces the ventral CSF. Uncovertebral spurring is worse on the left. The foramina are patent. C7-T1:  Negative. IMPRESSION: 1. Multilevel uncovertebral spurring is left greater than right. No focal foraminal stenosis is evident. 2. Rightward disc osteophyte complex at C5-6 with effacement ventral CSF and  slight distortion of the ventral surface the cord on the right but no abnormal cord signal. This represents moderate right central canal stenosis. 3. Central disc protrusion with partial effacement of the ventral CSF at C4-5 and C6-7. Electronically Signed   By: Marin Robertshristopher  Mattern M.D.   On: 08/28/2016 08:07    Procedures Procedures (including critical care time)  Medications Ordered in ED Medications  LORazepam (ATIVAN) tablet 1 mg (1 mg Oral Given 08/29/16 1236)  lidocaine (XYLOCAINE) 1 % (with pres) injection 10 mL (10 mLs Other Given 08/29/16 1431)     Initial Impression / Assessment and Plan / ED Course  I have reviewed the triage vital signs and the nursing notes.  Pertinent labs & imaging results that were available during my care of the patient were reviewed by me and considered in my medical decision making (see chart for details).  Clinical Course   Spinal fluid is reassuring. No evidence of meningitis or encephalitis. I did add on arbovirus and lyme titers.  Patient is concerned that she may have chronic Lyme disease.  Patient also asked about having her spinal fluid sent off to an outside laboratory for further testing. I have asked the nurses to see if the lab could hold onto her spinal fluid. Patient will follow up with her primary doctor to discuss possible further testing.   Final Clinical Impressions(s) / ED Diagnoses   Final diagnoses:  Chronic nonintractable headache, unspecified headache type    New Prescriptions New Prescriptions   No medications on file     Linwood DibblesJon Whitaker Holderman, MD 08/29/16 1533

## 2016-08-29 NOTE — ED Triage Notes (Signed)
Pt reports HA at base of skull since Friday. Had C spine MRI yesterday, but has not had a chance to follow up. 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 Pt thinks she has also had Lyme disease since 2013.

## 2016-08-29 NOTE — ED Notes (Signed)
Pt asking if we can hold her CSF d/t possibly wanting additional testing.  This Clinical research associatewriter [facility] and was informed that they hold CSF for around 2 weeks.  Sts if they receive an order from another MD for additional testing or to be sent out they can complete the orders.

## 2016-08-29 NOTE — Telephone Encounter (Signed)
Dr Lucia GaskinsAhern- FYI  Called pt back. Advised per Dr Lucia GaskinsAhern, she should follow recommendations at ER. She should stay there and be evaluated. Advised she can call if she has any further questions. She verbalized understanding.

## 2016-08-29 NOTE — Telephone Encounter (Signed)
[facility], has horrible headache right now, states ED Doctor asks if Dr. Lucia GaskinsAhern would rather do spinal tap, states it can be done there Patient also adds that Western Blot test at Dr. Alphonsus SiasHolwerda's office came back positive for Lyme disease.

## 2016-08-31 ENCOUNTER — Emergency Department (HOSPITAL_COMMUNITY)
Admission: EM | Admit: 2016-08-31 | Discharge: 2016-08-31 | Disposition: A | Discharge status: Home / Self Care | Attending: Emergency Medicine | Admitting: Emergency Medicine

## 2016-08-31 DIAGNOSIS — Z7982 Long term (current) use of aspirin: Secondary | ICD-10-CM | POA: Diagnosis not present

## 2016-08-31 DIAGNOSIS — R51 Headache: Secondary | ICD-10-CM | POA: Diagnosis present

## 2016-08-31 DIAGNOSIS — Z79899 Other long term (current) drug therapy: Secondary | ICD-10-CM | POA: Insufficient documentation

## 2016-08-31 DIAGNOSIS — G4489 Other headache syndrome: Secondary | ICD-10-CM | POA: Insufficient documentation

## 2016-08-31 MED ORDER — IBUPROFEN 200 MG PO TABS
400.0000 mg | ORAL_TABLET | Freq: Once | ORAL | Status: AC
  Administered 2016-08-31: 400 mg via ORAL
  Filled 2016-08-31: qty 2

## 2016-08-31 NOTE — ED Provider Notes (Signed)
[facility]-EMERGENCY DEPT Provider Note   CSN: [id number] Arrival date & time: 08/31/16  0805  By signing my name below, I, Placido Sou, attest that this documentation has been prepared under the direction and in the presence of Raeford Razor, MD. Electronically Signed: Placido Sou, ED Scribe. 08/31/16. 9:17 AM.   History   Chief Complaint Chief Complaint  Patient presents with  . Headache  . Diarrhea    HPI HPI Comments: Kathryn Riggs is a 45 y.o. female who presents to the Emergency Department complaining of constant, mild, posterior HA x 1 month. Pt states her HA is focused to her posterior scalp and bilateral occipital regions. Pt was seen two days ago for a HA, had an LP performed and had no evidence of meningitis or encephalitis. Pt was concerned regarding chronic lyme's at her prior visit and notes she had been treated for this in the past-both arbovirus and lyme titers were added. Pt states her HA has somewhat alleviated since her visit and was still feeling nauseous both last night and this morning. Pt states last night she was experiencing severe chills and shaking and woke up later "drenched in sweat". Her HA mildly alleviates when lying on her side. She took methocarbamol yesterday and 500 mg of tylenol at 4:00 am today with mild relief. She denies visual changes, back pain, itchiness, rash to her hands or feet and urinary changes.    She notes her had a western blot come back positive for lyme's 4 days ago further noting she also tested positive for [place] spotted fever and completed a 10 day course of doxycycline on 07/22/2016. Pt is currently on another course of doxycycline which she began this week.   The history is provided by the patient. No language interpreter was used.    Past Medical History:  Diagnosis Date  . Anemia   . Laryngopharyngeal reflux (LPR)   . Migraines   . [place] spotted fever     Patient Active Problem List   Diagnosis Date  Noted  . Insomnia 05/14/2016  . 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 Muscle pain 04/30/2016  . Achilles tendinosis 04/30/2016    No past surgical history on file.  OB History    No data available       Home Medications    Prior to Admission medications   Medication Sig Start Date End Date Taking? Authorizing Provider  aspirin-acetaminophen-caffeine (EXCEDRIN MIGRAINE) [id number] MG tablet Take 1 tablet by mouth every 6 (six) hours as needed for headache.   Yes Historical Provider, MD  Cholecalciferol (VITAMIN D3) 1000 units CHEW Chew 1 tablet by mouth daily.   Yes Historical Provider, MD  doxycycline (VIBRA-TABS) 100 MG tablet Take 100 mg by mouth 2 (two) times daily.   Yes Historical Provider, MD  ferrous gluconate (FERGON) 324 MG tablet Take 324 mg by mouth daily with breakfast.   Yes Historical Provider, MD  meclizine (ANTIVERT) 25 MG tablet Take 1 tablet (25 mg total) by mouth 3 (three) times daily as needed for dizziness. 07/30/16  Yes Loren Racer, MD  Multiple Vitamins-Minerals (MULTIVITAMIN ADULT) CHEW Chew 1 tablet by mouth daily.   Yes Historical Provider, MD  Omega Fatty Acids-Vitamins (OMEGA-3 GUMMIES PO) Take 1 tablet by mouth daily.   Yes Historical Provider, MD  Probiotic Product (PROBIOTIC-10) CHEW Chew 1 tablet by mouth daily.   Yes Historical Provider, MD  DULoxetine (CYMBALTA) 30 MG capsule Take 1 capsule (30 mg total) by mouth  daily. Patient not taking: Reported on 08/10/2016 07/03/16   Anson FretAntonia B Ahern, MD    Family History Family History  Problem Relation Age of Onset  . Stroke Neg Hx   . Neuropathy Neg Hx   . Multiple sclerosis Neg Hx   . Migraines Neg Hx     Social History Social History  Substance Use Topics  . Smoking status: Never Smoker  . Smokeless tobacco: Never Used  . Alcohol use No     Allergies   Review of patient's allergies indicates no known allergies.   Review of Systems Review of Systems    Constitutional: Positive for chills and diaphoresis.  Eyes: Negative for visual disturbance.  Gastrointestinal: Positive for nausea. Negative for vomiting.  Genitourinary: Negative for difficulty urinating, dysuria and urgency.  Musculoskeletal: Negative for back pain.  Skin: Negative for rash.  Neurological: Positive for headaches.  All other systems reviewed and are negative.  Physical Exam Updated Vital Signs BP 116/86 (BP Location: Left Arm)   Pulse 90   Temp 98.3 F (36.8 C) (Oral)   Ht 5\' 7"  (1.702 m)   Wt 168 lb (76.2 kg)   LMP 08/18/2016   SpO2 100%   BMI 26.31 kg/m   Physical Exam  Constitutional: She is oriented to person, place, and time. She appears well-developed and well-nourished. No distress.  HENT:  Head: Normocephalic and atraumatic.  Eyes: EOM are normal.  Neck: Normal range of motion.  Cardiovascular: Normal rate, regular rhythm and normal heart sounds.   Pulmonary/Chest: Effort normal and breath sounds normal.  Abdominal: Soft. She exhibits no distension. There is no tenderness.  Musculoskeletal: Normal range of motion.  Neurological: She is alert and oriented to person, place, and time.  Good finger to nose.   Skin: Skin is warm and dry.  Psychiatric: She has a normal mood and affect. Judgment normal.  Nursing note and vitals reviewed.  ED Treatments / Results  Labs (all labs ordered are listed, but only abnormal results are displayed) Labs Reviewed - No data to display  EKG  EKG Interpretation None       Radiology No results found.  Procedures Procedures  DIAGNOSTIC STUDIES: Oxygen Saturation is 100% on RA, normal by my interpretation.    COORDINATION OF CARE: 9:17 AM Discussed next steps with pt. Pt verbalized understanding and is agreeable with the plan.    Medications Ordered in ED Medications - No data to display   Initial Impression / Assessment and Plan / ED Course  I have reviewed the triage vital signs and the nursing  notes.  Pertinent labs & imaging results that were available during my care of the patient were reviewed by me and considered in my medical decision making (see chart for details).  Clinical Course    45yF with a litany of complaints of varying chronicity. She has been extensively worked up for them. Reviewed some of her recent testing which she had questions about. She is in NAD. I doubt emergent condition. I doubt her HA is post-LP. Not positional. Neuro exam nonfocal. I doubt serious etiology.  tried ro reassure her. Outpt FU.   Final Clinical Impressions(s) / ED Diagnoses   Final diagnoses:  Headache syndrome   I personally preformed the services scribed in my presence. The recorded information has been reviewed is accurate. Raeford RazorStephen Chino Sardo, MD.  New Prescriptions New Prescriptions   No medications on file     Raeford RazorStephen Wasif Simonich, MD 09/05/16 1426

## 2016-08-31 NOTE — ED Triage Notes (Signed)
Patient presents to ED with complaints of nausea, diarrhea, and severe headache. She states that she came to the ED with main concerns of the headache because she had a lumbar puncture 2 days ago. She reports no vomiting, watery diarrhea, and describes the headache as mild and is more present on the backside of her head and ears. She states she recently had a Western Blot come back as positive for Lyme's disease.

## 2016-08-31 NOTE — ED Notes (Signed)
Patient with vague and numerous complaints.  Headache x1 month, lumbar puncture here 2 days ago - headache better, but still present - worse with leaning forward.  Patient also has hx of dysphagia several months ago, parasites and achilles pain.  She has neurologist and GI providers as well as orthopedists.  She had + Western Blot on Thursday for Lyme's disease and recently started on doxycycline.  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

## 2016-09-02 LAB — CSF CULTURE W GRAM STAIN: Culture: NO GROWTH

## 2016-09-02 LAB — CSF CULTURE

## 2016-09-02 LAB — LYME DISEASE DNA BY PCR(BORRELIA BURG): LYME DISEASE(B. BURGDORFERI) PCR: NEGATIVE

## 2016-09-03 ENCOUNTER — Ambulatory Visit: Admitting: Rehabilitative and Restorative Service Providers"

## 2016-09-03 ENCOUNTER — Telehealth: Payer: Self-pay | Admitting: Infectious Disease

## 2016-09-03 NOTE — Telephone Encounter (Signed)
This patient was referred to our clinic for "chronic lyme disease" which is actually not a chronic infectious disease but rather a chronic post infectious syndrome.  Her Lyme EIA was negative but regardless she had Lyme Western blot antibodies sent (inappropriately) in that the patient  #1 had a negative EIA and therefore WB not indicated  And  #2 does not at all have  History or syndrome c/w actual infection with Borrellia   Her IgM for Borrellia was + with 2/3 antibodies a p41 and p23 positive HOWEVER the IgM is frequently FALSELY POSITIVE in large percentage of healthy individuals.  Her IgG which was NEGATIVE with only one band SHOULD HAVE been positive if this was a chronic infection.  The IDSA  actually recommends against testing for Lyme when patient has chronic non specific symptoms.   The CDC specificallly instructs providers NOT to check Lyme IgM WB in patients with symptoms greater than 30 days  THEREFORE THIS PATIENT 100% DOES NOT HAVE "LYME DISEASE" THERE IS NO INDICATION FOR ID CONSULT here, [facility] elsewhere.  I would encourage PCP to discourage pt from referring to inter net in particular due to risk of her being treated by MDs who practice medicine outside the realm of science

## 2016-09-04 NOTE — Telephone Encounter (Signed)
Pt called said her PCP referred her to Dr Yetta BarreJones (neurosurgeon), she saw him today and confirmed the vertigo and HA's are not related to her neck. He suggested she see neurologist. The pt said last week it was confirmed that she did had lymes disease.

## 2016-09-04 NOTE — Telephone Encounter (Signed)
Dr Ahern/Carolyn- Lorain ChildesFYI  Called and spoke to pt. Advised per Dr Lucia GaskinsAhern she can schedule f/u with NP. Scheduled f/u with CM, NP on 09/06/16 at 1130am, check in 1115am. Dr Lucia GaskinsAhern can always speak to CM, NP while she is here.  Pt verbalized understanding.

## 2016-09-05 LAB — ARBOVIRUS IGG, CSF: WEST NILE IGG CSF: 0.74 IV (ref <1.30)

## 2016-09-05 NOTE — Telephone Encounter (Signed)
Noted  

## 2016-09-06 ENCOUNTER — Ambulatory Visit: Payer: Self-pay | Admitting: Nurse Practitioner

## 2016-09-10 ENCOUNTER — Ambulatory Visit: Attending: Neurology | Admitting: Physical Therapy

## 2016-09-10 DIAGNOSIS — R2689 Other abnormalities of gait and mobility: Secondary | ICD-10-CM | POA: Insufficient documentation

## 2016-09-10 DIAGNOSIS — R42 Dizziness and giddiness: Secondary | ICD-10-CM | POA: Insufficient documentation

## 2016-09-10 NOTE — Patient Instructions (Signed)
1.The goal of habituation training is to assist in decreasing symptoms of vertigo, dizziness, or nausea provoked by specific head and body motions. 2.These exercises may initially increase symptoms; however, be persistent and work through symptoms. With repetition and time, the exercises will assist in reducing or eliminating symptoms. 3.Exercises should be stopped and discussed with the therapist if you experience any of the following: - Sudden change or fluctuation in hearing - New onset of ringing in the ears, or increase in current intensity - Any fluid discharge from the ear - Severe pain in neck or back - Extreme nausea  Copyright  VHI. All rights reserved.  Sit to Side-Lying   Sit on edge of bed. Lie down onto the right side and hold until dizziness stops, plus 20 seconds.  Return to sitting and wait until dizziness stops, plus 20 seconds.  Repeat to the left side. Repeat sequence 5 times per session. Do 2 sessions per day.  Gaze Stabilization: Tip Card 1.Target must remain in focus, not blurry, and appear stationary while head is in motion. 2.Perform exercises with small head movements (45 to either side of midline). 3.Increase speed of head motion so long as target is in focus. 4.If you wear eyeglasses, be sure you can see target through lens (therapist will give specific instructions for bifocal / progressive lenses). 5.These exercises may provoke dizziness or nausea. Work through these symptoms. If too dizzy, slow head movement slightly. Rest between each exercise. 6.Exercises demand concentration; avoid distractions. 7.For safety, perform standing exercises close to a counter, wall, corner, or next to someone.  Copyright  VHI. All rights reserved.  Gaze Stabilization: Standing Feet Apart   BEGIN SEATED 3 FEET FROM TARGET (PLACED SLIGHTLY BELOW EYE LEVEL) Feet shoulder width apart, keeping eyes on target on wall 3 feet away, tilt head down slightly and move head side to side  for 30 seconds. Repeat while moving head up and down for 30 seconds. Do 2 sessions per day.  *This should not increase neck pain.   Walking Head Turn    Standing close to your countertop, walk the length of the counter while turning head from right to left (2 steps with head to right; 2 steps with head to left).  Touch wall if necessary to keep balance. Practice this for a total of 4-5 minutes per day.  Copyright  VHI. All rights reserved.

## 2016-09-10 NOTE — Therapy (Signed)
[facility] [address] [place], [place], [address] Phone: [phone]   Fax:  [phone]  Physical Therapy Treatment  Patient Details  Name: Kathryn Riggs MRN: [id number] Date of Birth: November 22, 1971 Referring Provider: Naomie Dean, MD  Encounter Date: 09/10/2016      PT End of Session - 09/10/16 0937    Visit Number 4   Number of Visits 8  requesting 4 additional sessions   Date for PT Re-Evaluation 10/10/16   Authorization Type [employer], [employer]   PT Start Time [phone]   PT Stop Time 0931   PT Time Calculation (min) 42 min   Activity Tolerance Other (comment)  Limited by guarded neck position   Behavior During Therapy Anxious      Past Medical History:  Diagnosis Date  . Anemia   . Laryngopharyngeal reflux (LPR)   . Migraines   . [facility] spotted fever     No past surgical history on file.  There were no vitals filed for this visit.      Subjective Assessment - 09/10/16 0851    Subjective "I've pretty much eliminated the exercise where I'm lying on my side and sitting up...because it was increasing my headache and nausea. I don't know if you read this, but my Lyme disease Chad Blot came back positive. The dizziness is improved.Marland Kitchenit's not perfect."   Pertinent History *Do not touch patient's face or neck, per patient request.  Likes to be called "Kathryn Riggs". PMH significant for: masticatory myalgia, bruxism Achilles tendinosis, insomnia , (+) RMSF, anemia, laryngopharyngeal reflux, DDD C4-C7   Patient Stated Goals "To manage the dizziness."   Currently in Pain? No/denies            [facility] PT Assessment - 09/10/16 0001      Assessment   Medical Diagnosis Benign paroxysmal positional vertigo, unspecified laterality   Referring Provider Naomie Dean, MD   Onset Date/Surgical Date 07/27/16     Prior Function   Level of Independence Independent   Vocation Works at home   Leisure likes spinning  class, dancing, hiking, sailing and would love to return to these activities            Vestibular Assessment - 09/10/16 0001      Positional Testing   Sidelying Test Sidelying Right;Sidelying Left   Horizontal Canal Testing Horizontal Canal Right;Horizontal Canal Left     Sidelying Right   Sidelying Right Duration No nystagmus; mild dizziness for 10-15 sec   Sidelying Right Symptoms No nystagmus     Sidelying Left   Sidelying Left Duration No nystagmus; mild dizziness for <10 sec   Sidelying Left Symptoms No nystagmus     Horizontal Canal Right   Horizontal Canal Right Duration NA   Horizontal Canal Right Symptoms Normal     Horizontal Canal Left   Horizontal Canal Left Duration NA   Horizontal Canal Left Symptoms Normal     Positional Sensitivities   Positional Sensitivities Comments 5/10 dizziness with R sidelying > sit; 4/10 with L sidelying > sit                 [facility] Adult PT Treatment/Exercise - 09/10/16 0001      Transfers   Transfers Sit to Stand;Stand to Sit   Sit to Stand 7: Independent   Stand to Sit 7: Independent     Ambulation/Gait   Ambulation/Gait Yes   Ambulation/Gait Assistance 6: Modified independent (Device/Increase time)   Ambulation/Gait Assistance Details Mod  I for stability/balance; however, note pt's neck position very guarded during gait. Pt turns en bloc.   Ambulation Distance (Feet) 100 Feet   Assistive device None   Ambulation Surface Level;Indoor         Vestibular Treatment/Exercise - 09/10/16 0001      Vestibular Treatment/Exercise   Vestibular Treatment Provided Gaze;Habituation     [name]   Number of Reps  2   Symptom Description  Reviewed [name]. No cueing required for technique/     X1 Viewing Horizontal   Foot Position standing; feet shoulder width apart   Reps 1   Comments increased duration to 45 seconds; pt tolerated well. Effective between-session carryover of technique     X1 Viewing  Vertical   Foot Position standing; feet shoulder width apart   Reps 1   Comments 45 seconds            Balance Exercises - 09/10/16 0948      Balance Exercises: Standing   Standing Eyes Opened Narrow base of support (BOS);Head turns;5 reps;Solid surface   Gait with Head Turns Forward;4 reps;Other (comment)  3 x35' with horiz head turns; 1 x35' with vertical turns   Other Standing Exercises Pt reports 3/10 dizziness after gait x35' with concurrent horizontal head turns. No significant dizziness/disequilibrium during gait with vertical head turns.           PT Education - 09/10/16 0936    Education provided Yes   Education Details Educated pt on importance of functional head movement, compliance with habituation to decrease motion sensitivity. Explained PT goals, findings, progress, and POC of 1x/week for 4 additional weeks.   Person(s) Educated Patient   Methods Explanation;Demonstration;Verbal cues;Handout   Comprehension Returned demonstration;Verbalized understanding          PT Short Term Goals - 08/10/16 1848      PT SHORT TERM GOAL #1   Title STG's = LTG's           PT Long Term Goals - 09/10/16 4431      PT LONG TERM GOAL #1   Title Pt will independently perform vestibular HEP to maximize functional gains in therapy.   (Modified Target date: 10/08/16)   Baseline 9/18: Pt independent with HEP but has not been consistently compliant with habituation due to pt report of severe headache with this exercise.  Continue goal through renewed POC.   Status Partially Met     PT LONG TERM GOAL #2   Title Positional vertigo testing will be negative to indicate resolved BPPV.  (09/07/16)   Baseline Met 9/18.   Status Achieved     PT LONG TERM GOAL #3   Title Pt will decrease DHI score from 66 to < / = 38 to indicate significant decrease in pt-perceived disability due to dizziness.  (Modified Target date: 10/08/16)   Baseline 9/18: [place] = 66  Continue goal through  renewed POC.   Status Not Met     PT LONG TERM GOAL #4   Title Pt will perform sit <> sidelying (bilateral directions) with no increase in symptoms of dizziness/disequilibrium to indicate decreased motion sensitivity.  (Target: 10/08/16)   Status New     PT LONG TERM GOAL #5   Title Pt will ambulate 200' over unlevel, paved surfaces with concurrent intermittent horizontal head turns with no more than 2-point increase in dizziness to indicate increased tolerance to functional head turns.  (Target: 01/09/16)   Status New  Plan - 09/10/16 0949    Clinical Impression Statement Current session was the first time pt has returned to PT since 8/30. Session focused on assessing LTG's and determining if appropriate to continue vestibular PT. LTG for (-) positional testing met; however, pt did report motion sensitivty with B Sidelying Test (R > L) during this session.  LTG for [place] unmet, as DHI score has actually increased since PT evaluation, despite pt report of improved symptoms. LTG for HEP partially met, as pt is indepedent with HEP but has not performed habituation for past 2 weeks due to pt perception of exercise increasing headache. Provided extensive education that pt must perform habituation HEP daily and should make a conscious effort to begin moving head in functional context in order to make progress with PT. Per pt inquiry as to whether this PT would perform cervical spine mobilizations, this therapist recommended that next 4 weeks focus on dizziness/motion sensitivity prior to initiating manual therapy for cervical spine. Pt in agreement.   Rehab Potential Fair   Clinical Impairments Affecting Rehab Potential anxiety, fear of movement   PT Frequency 1x / week   PT Duration 4 weeks   PT Treatment/Interventions ADLs/Self Care Home Management;Patient/family education;Neuromuscular re-education;Therapeutic exercise;Balance training;Therapeutic activities;Functional mobility  training;Stair training;Gait training;Canalith Repostioning;Vestibular;Manual techniques   PT Next Visit Plan Assess vestibular HEP and progress prn.   PT Home Exercise Plan 9/18: added gait with horizontal head turns. Meant to progress gaze stabilization to 45 seconds, but forgot to modify this on HEP handout.   Consulted and Agree with Plan of Care Patient      Patient will benefit from skilled therapeutic intervention in order to improve the following deficits and impairments:  Dizziness, Abnormal gait, Other (comment), Pain (Pain will be monitored but not directly addressed by PT due to nature of referral.)  Visit Diagnosis: Dizziness and giddiness - Plan: PT plan of care cert/re-cert  Other abnormalities of gait and mobility - Plan: PT plan of care cert/re-cert     Problem List Patient Active Problem List   Diagnosis Date Noted  . Insomnia 05/14/2016  . 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 Muscle pain 04/30/2016  . Achilles tendinosis 04/30/2016    Billie Ruddy, PT, DPT [facility] [address] [place], [place], [address] Phone: [phone]   Fax:  [phone] 09/10/16, 10:10 AM  Name: Kathryn Riggs MRN: [id number] Date of Birth: 05/14/1971

## 2016-09-11 ENCOUNTER — Ambulatory Visit: Payer: Self-pay | Admitting: Nurse Practitioner

## 2016-09-19 ENCOUNTER — Ambulatory Visit: Admitting: Physical Therapy

## 2016-09-19 DIAGNOSIS — R42 Dizziness and giddiness: Secondary | ICD-10-CM | POA: Diagnosis not present

## 2016-09-19 NOTE — Therapy (Signed)
[facility] [address] [place], [place], [address] Phone: [phone]   Fax:  [phone]  Physical Therapy Treatment and Discharge Summary  Patient Details  Name: Kathryn Riggs MRN: [id number] Date of Birth: 1971/09/26 Referring Provider: [name], MD  Encounter Date: 09/19/2016      PT End of Session - 09/19/16 1125    Visit Number 5   Number of Visits 8   Date for PT Re-Evaluation 10/10/16   Authorization Type [employer], [employer]   PT Start Time [phone]  Pt arrived late to session   PT Stop Time 0842  Session ended early due to pt discharged   PT Time Calculation (min) 26 min   Activity Tolerance Other (comment)  limited by guarded neck position   Behavior During Therapy Anxious      Past Medical History:  Diagnosis Date  . Anemia   . Laryngopharyngeal reflux (LPR)   . Migraines   . [facility] spotted fever     No past surgical history on file.  There were no vitals filed for this visit.      Subjective Assessment - 09/19/16 0818    Subjective "The vertigo stuff is somewhat better.Kathryn KitchenMarland KitchenI have a headache behind my eyes. The neck range of motion is somewhat limited this morning. I have a crick in my neck." Pt has been able to tolerate habituation exercises at home.   Pertinent History *Do not touch patient's face or neck, per patient request.  Likes to be called "[name]". PMH significant for: masticatory myalgia, bruxism Achilles tendinosis, insomnia , (+) RMSF, anemia, laryngopharyngeal reflux, DDD C4-C7   Patient Stated Goals "To manage the dizziness."   Currently in Pain? Yes   Pain Score 3    Pain Location Head   Pain Orientation Other (Comment)  behind eyes   Pain Descriptors / Indicators Headache   Pain Type Chronic pain   Pain Onset More than a month ago   Pain Frequency Intermittent   Aggravating Factors  moving eyes downward   Pain Relieving Factors not moving head   Multiple Pain  Sites No                Vestibular Assessment - 09/19/16 0001      Positional Testing   Sidelying Test Sidelying Right;Sidelying Left   Horizontal Canal Testing Horizontal Canal Right;Horizontal Canal Left     Sidelying Right   Sidelying Right Duration Asymptomatic   Sidelying Right Symptoms No nystagmus     Sidelying Left   Sidelying Left Duration Asymptomatic   Sidelying Left Symptoms No nystagmus     Horizontal Canal Right   Horizontal Canal Right Duration Asymptomatic   Horizontal Canal Right Symptoms Normal     Horizontal Canal Left   Horizontal Canal Left Duration Asymptomatic   Horizontal Canal Left Symptoms Normal                 [facility] Adult PT Treatment/Exercise - 09/19/16 0001      Ambulation/Gait   Ambulation/Gait Yes   Ambulation/Gait Assistance 7: Independent         Vestibular Treatment/Exercise - 09/19/16 0001      Vestibular Treatment/Exercise   Vestibular Treatment Provided Habituation   Habituation Exercises 360 degree Turns     Kathryn Riggs   Number of Reps  2   Symptom Description  No symptoms in R/L sidelying; mild symptoms with transition from R/L sidelying > sit, which resolve quickly     360  degree Turns   Number of Reps  4   Symptom Description  "feels a little wavy" per pt   COMMENT x2 reps to R, x2 reps to L; required 30-60 seconds between turns for symtoms to settle            Balance Exercises - 09/19/16 1123      Balance Exercises: Standing   Gait with Head Turns Forward;2 reps;Other (comment)  2 x30' with limited horizontal head movement           PT Education - 09/19/16 1038    Education provided Yes   Education Details Recommended pt continue PT exercises 2x/day until no further symptoms. Explained that BPPV has cleared and motion sensitivity should resolve with HEP provided during previous sessions. Explained that pt may benefit from counseling to manage anxiety associated with symptoms.     Person(s) Educated Patient   Methods Explanation   Comprehension Verbalized understanding          PT Short Term Goals - 08/10/16 1848      PT SHORT TERM GOAL #1   Title STG's = LTG's           PT Long Term Goals - 09/19/16 1123      PT LONG TERM GOAL #1   Title Pt will independently perform vestibular HEP to maximize functional gains in therapy.   (Modified Target date: 10/08/16)   Baseline Met 9/27.   Status Achieved     PT LONG TERM GOAL #2   Title Positional vertigo testing will be negative to indicate resolved BPPV.  (09/07/16)   Baseline Met 9/18.   Status Achieved     PT LONG TERM GOAL #3   Title Pt will decrease DHI score from 66 to < / = 38 to indicate significant decrease in pt-perceived disability due to dizziness.  (Modified Target date: 10/08/16)   Baseline 9/18: [place] = 66   Status Not Met     PT LONG TERM GOAL #4   Title Pt will perform sit <> sidelying (bilateral directions) with no increase in symptoms of dizziness/disequilibrium to indicate decreased motion sensitivity.  (Target: 10/08/16)   Baseline 9/27: no symptoms in sidelying (bilaterally); mild symptoms with R/L sidelying > sit   Status Partially Met     PT LONG TERM GOAL #5   Title Pt will ambulate 200' over unlevel, paved surfaces with concurrent intermittent horizontal head turns with no more than 2-point increase in dizziness to indicate increased tolerance to functional head turns.  (Target: 01/09/16)   Baseline 9/27: unable to formally assess, as head movement limited by cervical spine pain   Status Not Met               Plan - 09/19/16 1130    Clinical Impression Statement BPPV is now cleared, as positional testing is asymptomatic and (-) for nystagmus. Pt has been able to tolerate habituation HEP since last session and does report improved symptoms with sit <> R/L sidelying during this session. Pain and guarding with active neck ROM continues to limit pt participation in vestibular PT  and pt progress in therapy. Moreover, pt fear of underlying/undiscovered medical problem greatly limits pt participation. Pt has demonstrated ability to safely perform habituation and gaze stabilization HEP; and therefore, no further vestibular PT is indicated at this time.    Consulted and Agree with Plan of Care Patient      Patient will benefit from skilled therapeutic intervention in order to improve the  following deficits and impairments:     Visit Diagnosis: Dizziness and giddiness     Problem List Patient Active Problem List   Diagnosis Date Noted  . Insomnia 05/14/2016  . 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 Muscle pain 04/30/2016  . Achilles tendinosis 04/30/2016   PHYSICAL THERAPY DISCHARGE SUMMARY  Visits from Start of Care: 5  Current functional level related to goals / functional outcomes: See above.   Remaining deficits: Pt continues to complain of "wavy" feeling with horizontal head/body turns; also describes dizziness/disequilibrium with R/L side ying > sit.   Education / Equipment: See above. Plan: Patient agrees to discharge.  Patient goals were partially met. Patient is being discharged due to lack of progress.  ?????        Billie Ruddy, PT, [facility] [address] [place], [place], [address] Phone: [phone]   Fax:  [phone] 09/19/16, 11:40 AM    Name: Hayde Kilgour MRN: [id number] Date of Birth: 1971-05-26

## 2016-09-26 ENCOUNTER — Encounter: Admitting: Physical Therapy

## 2016-10-03 ENCOUNTER — Encounter: Admitting: Physical Therapy

## 2016-10-10 ENCOUNTER — Encounter: Admitting: Physical Therapy

## 2016-10-29 ENCOUNTER — Ambulatory Visit (INDEPENDENT_AMBULATORY_CARE_PROVIDER_SITE_OTHER): Admitting: Nurse Practitioner

## 2016-10-29 ENCOUNTER — Encounter: Payer: Self-pay | Admitting: Nurse Practitioner

## 2016-10-29 DIAGNOSIS — R519 Headache, unspecified: Secondary | ICD-10-CM

## 2016-10-29 DIAGNOSIS — R51 Headache: Secondary | ICD-10-CM

## 2016-10-29 DIAGNOSIS — R42 Dizziness and giddiness: Secondary | ICD-10-CM | POA: Diagnosis not present

## 2016-10-29 MED ORDER — TOPIRAMATE 25 MG PO TABS: 25.0000 mg | ORAL_TABLET | Freq: Two times a day (BID) | ORAL | 3 refills | Status: DC

## 2016-10-29 NOTE — Progress Notes (Addendum)
[name]  PATIENT: Kathryn Riggs DOB: 09-Nov-1971   REASON FOR VISIT: Follow-up for myalgias, headaches and dizziness HISTORY FROM: Patient    HISTORY OF PRESENT ILLNESS:UPDATE 11/06/2017CM Kathryn Riggs, 45 year old female returns for follow-up. She was last seen by Kathryn Riggs July 11. Lab work showed Kathryn Riggs, she was placed on doxycycline. 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 On today's visit she complains with headaches, tremors in her neck. Her headaches are described as head pressure with dizziness in the back of her head particularly when she lays down at night she has a buzzing sound. She has been to vestibular rehabilitation. She continues to do HEP. She has been taking Excedrin Migraine and was cautioned against  because of rebound. She did not have a sleep test.  MRI of the brain 02/28/2016 was normal. MRI of cervical spine 08/28/2016 Multilevel uncovertebral spurring is left greater than right. No focal foraminal stenosis is evident.2. Rightward disc osteophyte complex at C5-6 with effacement ventral, CSF and slight distortion of the ventral surface the cord on the right but no abnormal cord signal. This represents moderate right central canal stenosis. 3. Central disc protrusion with partial effacement of the ventral CSF at C4-5 and C6-7. She returns for reevaluation  Interval history 07/03/2016:  She saw the GI and the Infectious disease and workup was negative. She was told maybe she has Fibromyalgia. Sent her for sleep eval due to difficulties She was seen at [facility] for orofacial pain and has been to physical therapy. ANA comprehensive panel was negative, rheumatoid factor negative. She is getting PT for her joint and muscle pain. Patient's been struggling for quite a long time with her multiple complaints. She recently saw a GI and infectious disease specialists and had a  workup, she still ongoing workups. Nothing has been found. Discussed ANA comprehensive panel was negative, rheumatoid factor was negative. Looking back through records it does not appear she has ever been tested for Lyme or other tickborne illnesses. She thought she was, but she can't find any documentation either. We'll test her for Lyme, [facility] spotted Riggs, Bartonella and do a sedimentation rate which is nonspecific for inflammation. I discussed that I am really not sure that there is anything else we can do for her here in neurology however we could start Cymbalta or Lyrica or gabapentin or other medication and see if it helps to alleviate her symptoms. Discussed these medications and the different side effects and patient decided on Cymbalta. Discussed medication side effects as detailed in the patient instructions. We can start Cymbalta and happy to increase as long as patient tolerates it and needs an increase.  HPI: Kathryn Riggs is a 45 y.o. female here as a referral from Kathryn Riggs for facial pain. She has had a lot of "bizarre symptoms". She was in in [place] from 2009-2011. She was very active at the time. Her bowel movements started changing. She was dismissed. In 2013 she started having terrible achilles pain, synovitis, tendinosis. Burning pain in the ankles and tops of the feet. She had a mild foot drop due to being a boot or possibly for low back pain. In 2015 she had dysphagia, was seen by an ENT in 2016 and she had laryngeal reflux and omeprazole helped a little. EGD was normal. They moved in July and she went to [facility] and had a hiatal hernia, esophageal manometry showed weak peristalsis  then had several other tests including colonoscopy. She is improved with the dysphagia. She went to [facility] and she had a still sample and she had a tape worm and giardia. She went [facility] [place] and saw doctor Earley AbideElizabeth Boseman. She started notcing  her jaw was bothering her in August, random aching in the jaw. She saw a dentist and saw no reason for pain. Second opinion said also no reason for the bruxism. She has an aching jaw. Worse since seeing the dentist and having a tooth replaced. She wears a night guard. Flexeril has helped a lot at night. She is taking 5mg  at night. She has [facility] and gotten relaxation techniques and more flexeril. She has been to integrative therapies. She has jaw pain, she having difficulty chewing food, decreased ROM, improved with the flexeril. Patient would like a referral to infectious disease to discuss. She has been to rheumatology as well. She is snoring, excessive daytime fatigue, and morning headaches.   REVIEW OF SYSTEMS: Full 14 system review of systems performed and notable only for those listed, all others are neg:  Constitutional: neg  Cardiovascular: neg Ear/Nose/Throat: neg  Skin: neg Eyes: Light sensitivity Respiratory: neg Gastroitestinal: neg  Hematology/Lymphatic: Anemia Endocrine: neg Musculoskeletal: Joint pain, neck pain Allergy/Immunology: neg Neurological: Neck tremors,  Dizziness,  headache Psychiatric: Decreased concentration Sleep : neg   ALLERGIES: No Known Allergies  HOME MEDICATIONS: Outpatient Medications Prior to Visit  Medication Sig Dispense Refill  . aspirin-acetaminophen-caffeine (EXCEDRIN MIGRAINE) 250-250-65 MG tablet Take 1 tablet by mouth every 6 (six) hours as needed for headache.    . Cholecalciferol (VITAMIN D3) 1000 units CHEW Chew 1 tablet by mouth daily.    . ferrous gluconate (FERGON) 324 MG tablet Take 324 mg by mouth daily with breakfast.    . Multiple Vitamins-Minerals (MULTIVITAMIN ADULT) CHEW Chew 1 tablet by mouth daily.    Ailene Ards. Omega Fatty Acids-Vitamins (OMEGA-3 GUMMIES PO) Take 1 tablet by mouth daily.    . Probiotic Product (PROBIOTIC-10) CHEW Chew 1 tablet by mouth daily.    [name]. doxycycline (VIBRA-TABS) 100 MG  tablet Take 100 mg by mouth 2 (two) times daily.    . DULoxetine (CYMBALTA) 30 MG capsule Take 1 capsule (30 mg total) by mouth daily. (Patient not taking: Reported on 10/29/2016) 30 capsule 11  . meclizine (ANTIVERT) 25 MG tablet Take 1 tablet (25 mg total) by mouth 3 (three) times daily as needed for dizziness. (Patient not taking: Reported on 10/29/2016) 30 tablet 0   No facility-administered medications prior to visit.     PAST MEDICAL HISTORY: Past Medical History:  Diagnosis Date  . Anemia   . Laryngopharyngeal reflux (LPR)   . Migraines   . [place] spotted Riggs     PAST SURGICAL HISTORY: History reviewed. No pertinent surgical history.  FAMILY HISTORY: Family History  Problem Relation Age of Onset  . Stroke Neg Hx   . Neuropathy Neg Hx   . Multiple sclerosis Neg Hx   . Migraines Neg Hx     SOCIAL HISTORY: Social History   Social History  . Marital status: Married    Spouse name: N/A  . Number of children: N/A  . Years of education: N/A   Occupational History  . Not on file.   Social History Main Topics  . Smoking status: Never Smoker  . Smokeless tobacco: Never Used  . Alcohol use No  . Drug use:  No  . Sexual activity: Not on file   Other Topics Concern  . Not on file   Social History Narrative  . No narrative on file     PHYSICAL EXAM  Vitals:   10/29/16 0850  BP: 121/81  Pulse: 75  Weight: 170 lb 12.8 oz (77.5 kg)  Height: 5\' 7"  (1.702 m)   Body mass index is 26.75 kg/m.  Generalized: Well developed, in no acute distress  Head: normocephalic and atraumatic,. Oropharynx benign  Neck: Supple, no carotid bruits  Cardiac: Regular rate rhythm, no murmur  Musculoskeletal: No deformity   Neurological examination   Mentation: Alert oriented to time, place, history taking. Attention span and concentration appropriate. Recent and remote memory intact.  Follows all commands speech and language fluent.   Cranial nerve II-XII: Pupils were  equal round reactive to light extraocular movements were full, visual field were full on confrontational test. Facial sensation and strength were normal. hearing was intact to finger rubbing bilaterally. Uvula tongue midline. head turning and shoulder shrug were normal and symmetric.Tongue protrusion into cheek strength was normal. Motor: normal bulk and tone, full strength in the BUE, BLE, fine finger movements normal, no pronator drift. No focal weakness Sensory: normal and symmetric to light touch, pinprick, and  Vibration, in the upper and lower extremities Coordination: finger-nose-finger, heel-to-shin bilaterally, no dysmetria Reflexes: Brachioradialis 2/2, biceps 2/2, triceps 2/2, patellar 2/2, Achilles 2/2, plantar responses were flexor bilaterally. Gait and Station: Rising up from seated position without assistance, normal stance,  moderate stride, good arm swing, smooth turning, able to perform tiptoe, and heel walking without difficulty. Tandem gait is mildly unsteady   DIAGNOSTIC DATA (LABS, IMAGING, TESTING) - I reviewed patient records, labs, notes, testing and imaging myself where available.  Lab Results  Component Value Date   WBC 5.3 08/19/2016   HGB 13.3 08/19/2016   HCT 39.4 08/19/2016   MCV 88.7 08/19/2016   PLT 239 08/19/2016      Component Value Date/Time   NA 137 08/19/2016 1811   K 3.7 08/19/2016 1811   CL 105 08/19/2016 1811   CO2 25 08/19/2016 1811   GLUCOSE 88 08/19/2016 1811   BUN 9 08/19/2016 1811   CREATININE 0.75 08/19/2016 1811   CALCIUM 9.3 08/19/2016 1811   PROT 7.7 08/19/2016 1811   ALBUMIN 4.9 08/19/2016 1811   AST 20 08/19/2016 1811   ALT 17 08/19/2016 1811   ALKPHOS 39 08/19/2016 1811   BILITOT 0.4 08/19/2016 1811   GFRNONAA >60 08/19/2016 1811   GFRAA >60 08/19/2016 1811    ASSESSMENT AND PLAN 45 year old patient with a past medical history significant for multiple complaints, she reports she was recently diagnosed with [facility]  spotted Riggs and Lyme's disease and she received antibiotics. She did not have sleep study. On today's visit she continues to complain of head pressure dizziness particularly in the back of her head with a buzzing sound when she lays down.She has completed vestibular rehabilitation Discussed with Dr. Lucia GaskinsAhern PLAN: Begin Topamax 25 mg at bedtime for 1 week then increase to 2 at night Drink plenty of fluids (water) Avoid foods that cause migraines given and reviewed list Stop over-the-counter Excedrin Migraine due to rebound Migraine tracker free APP to record headaches F/U with Dr. Lucia GaskinsAhern in 2 months Vst time 30 min Nilda RiggsNancy Carolyn Martin, [facility], [facility], APRN  [facility] [address], [address] [place], [place] [address] [phone]

## 2016-10-29 NOTE — Patient Instructions (Signed)
Begin Topamax 25 mg at bedtime for 1 week then increase to 2 at night Drink plenty of fluids water Avoid foods that cause migraines F/U with Dr. Lucia GaskinsAhern in 2 months

## 2016-11-05 ENCOUNTER — Telehealth: Payer: Self-pay | Admitting: Neurology

## 2016-11-05 NOTE — Telephone Encounter (Signed)
LVM returning pt call. Advised office now closed. We open at 8am tomorrow morning.

## 2016-11-05 NOTE — Telephone Encounter (Signed)
Patient called, states she was here last Monday head, neck pain, dizziness since August 3rd, to advise, [facility] November [facility]th through [facility], show that she is still positive for active case of [facility], requests to speak to nurse. Please call [phone].

## 2017-01-07 ENCOUNTER — Ambulatory Visit: Admitting: Neurology

## 2017-03-07 ENCOUNTER — Other Ambulatory Visit: Payer: Self-pay | Admitting: Physician Assistant

## 2017-03-07 DIAGNOSIS — E559 Vitamin D deficiency, unspecified: Secondary | ICD-10-CM

## 2017-03-27 ENCOUNTER — Ambulatory Visit

## 2017-04-08 ENCOUNTER — Other Ambulatory Visit: Payer: Self-pay | Admitting: Physician Assistant

## 2017-04-08 DIAGNOSIS — Z1231 Encounter for screening mammogram for malignant neoplasm of breast: Secondary | ICD-10-CM

## 2017-04-15 ENCOUNTER — Ambulatory Visit
Admission: RE | Admit: 2017-04-15 | Discharge: 2017-04-15 | Disposition: A | Discharge status: Home / Self Care | Source: Ambulatory Visit | Attending: Physician Assistant | Admitting: Physician Assistant

## 2017-04-15 DIAGNOSIS — Z1231 Encounter for screening mammogram for malignant neoplasm of breast: Secondary | ICD-10-CM

## 2017-04-22 ENCOUNTER — Other Ambulatory Visit: Payer: Self-pay | Admitting: Physician Assistant

## 2017-04-22 DIAGNOSIS — R928 Other abnormal and inconclusive findings on diagnostic imaging of breast: Secondary | ICD-10-CM

## 2017-04-24 ENCOUNTER — Other Ambulatory Visit

## 2017-04-24 ENCOUNTER — Ambulatory Visit
Admission: RE | Admit: 2017-04-24 | Discharge: 2017-04-24 | Disposition: A | Discharge status: Home / Self Care | Source: Ambulatory Visit | Attending: Physician Assistant | Admitting: Physician Assistant

## 2017-04-24 ENCOUNTER — Other Ambulatory Visit: Payer: Self-pay | Admitting: Physician Assistant

## 2017-04-24 ENCOUNTER — Encounter

## 2017-04-24 DIAGNOSIS — R928 Other abnormal and inconclusive findings on diagnostic imaging of breast: Secondary | ICD-10-CM

## 2017-04-24 DIAGNOSIS — N6001 Solitary cyst of right breast: Secondary | ICD-10-CM

## 2017-04-27 NOTE — Progress Notes (Deleted)
  Kathryn ScaleZach Fatin Bachicha D.O. [facility] [address] [place], [place] [address] Phone: [phone] Subjective:    I'm seeing this patient by the request  of:    CC:   BJY:[id number]:Subjective  Kathryn BanningLizbeth Riggs is a 46 y.o. female coming in with complaint of ***     Past Medical History:  Diagnosis Date  . Anemia   . Laryngopharyngeal reflux (LPR)   . Migraines   . [facility] fever    No past surgical history on file. Social History   Social History  . Marital status: Married    Spouse name: N/A  . Number of children: N/A  . Years of education: N/A   Social History Main Topics  . Smoking status: Never Smoker  . Smokeless tobacco: Never Used  . Alcohol use No  . Drug use: No  . Sexual activity: Not on file   Other Topics Concern  . Not on file   Social History Narrative  . No narrative on file   No Known Allergies Family History  Problem Relation Age of Onset  . Breast cancer Paternal Aunt   . Stroke Neg Hx   . Neuropathy Neg Hx   . Multiple sclerosis Neg Hx   . Migraines Neg Hx     Past medical history, social, surgical and family history all reviewed in electronic medical record.  No pertanent information unless stated regarding to the chief complaint.   Review of Systems:Review of systems updated and as accurate as of 04/27/17  No headache, visual changes, nausea, vomiting, diarrhea, constipation, dizziness, abdominal pain, skin rash, fevers, chills, night sweats, weight loss, swollen lymph nodes, body aches, joint swelling, muscle aches, chest pain, shortness of breath, mood changes.   Objective  Last menstrual period 04/09/2017. Systems examined below as of 04/27/17   General: No apparent distress alert and oriented x3 mood and affect normal, dressed appropriately.  HEENT: Pupils equal, extraocular movements intact  Respiratory: Patient's speak in full sentences and does not appear short of breath  Cardiovascular: No lower extremity edema, non  tender, no erythema  Skin: Warm dry intact with no signs of infection or rash on extremities or on axial skeleton.  Abdomen: Soft nontender  Neuro: Cranial nerves II through XII are intact, neurovascularly intact in all extremities with 2+ DTRs and 2+ pulses.  Lymph: No lymphadenopathy of posterior or anterior cervical chain or axillae bilaterally.  Gait normal with good balance and coordination.  MSK:  Non tender with full range of motion and good stability and symmetric strength and tone of shoulders, elbows, wrist, , knee and ankles bilaterally.     Impression and Recommendations:     This case required medical decision making of moderate complexity.      Note: This dictation was prepared with Dragon dictation along with smaller phrase technology. Any transcriptional errors that result from this process are unintentional.

## 2017-04-29 ENCOUNTER — Ambulatory Visit: Admitting: Family Medicine

## 2017-05-16 ENCOUNTER — Other Ambulatory Visit: Payer: Self-pay | Admitting: Physician Assistant

## 2017-05-16 DIAGNOSIS — R109 Unspecified abdominal pain: Secondary | ICD-10-CM

## 2017-05-16 DIAGNOSIS — R110 Nausea: Secondary | ICD-10-CM

## 2017-05-17 ENCOUNTER — Other Ambulatory Visit

## 2017-06-05 ENCOUNTER — Other Ambulatory Visit

## 2017-08-20 NOTE — Progress Notes (Deleted)
  [name] [address] [place], [place] [address] Phone: [phone] Subjective:    I'm seeing this patient by the request  of:    CC: Shoulder pain  MWU:[id number]  Kathryn Riggs is a 46 y.o. female coming in with complaint of ***  Onset-  Location Duration-  Character- Aggravating factors- Reliving factors-  Therapies tried-  Severity-     Past Medical History:  Diagnosis Date  . Anemia   . Laryngopharyngeal reflux (LPR)   . Migraines   . [facility] spotted fever    No past surgical history on file. Social History   Social History  . Marital status: Married    Spouse name: N/A  . Number of children: N/A  . Years of education: N/A   Social History Main Topics  . Smoking status: Never Smoker  . Smokeless tobacco: Never Used  . Alcohol use No  . Drug use: No  . Sexual activity: Not on file   Other Topics Concern  . Not on file   Social History Narrative  . No narrative on file   No Known Allergies Family History  Problem Relation Age of Onset  . Breast cancer Paternal Aunt   . Stroke Neg Hx   . Neuropathy Neg Hx   . Multiple sclerosis Neg Hx   . Migraines Neg Hx      Past medical history, social, surgical and family history all reviewed in electronic medical record.  No pertanent information unless stated regarding to the chief complaint.   Review of Systems:Review of systems updated and as accurate as of 08/20/17  No headache, visual changes, nausea, vomiting, diarrhea, constipation, dizziness, abdominal pain, skin rash, fevers, chills, night sweats, weight loss, swollen lymph nodes, body aches, joint swelling, muscle aches, chest pain, shortness of breath, mood changes.   Objective  There were no vitals taken for this visit. Systems examined below as of 08/20/17   General: No apparent distress alert and oriented x3 mood and affect normal, dressed appropriately.  HEENT: Pupils equal, extraocular movements  intact  Respiratory: Patient's speak in full sentences and does not appear short of breath  Cardiovascular: No lower extremity edema, non tender, no erythema  Skin: Warm dry intact with no signs of infection or rash on extremities or on axial skeleton.  Abdomen: Soft nontender  Neuro: Cranial nerves II through XII are intact, neurovascularly intact in all extremities with 2+ DTRs and 2+ pulses.  Lymph: No lymphadenopathy of posterior or anterior cervical chain or axillae bilaterally.  Gait normal with good balance and coordination.  MSK:  Non tender with full range of motion and good stability and symmetric strength and tone of shoulders, elbows, wrist, hip, knee and ankles bilaterally.     Impression and Recommendations:     This case required medical decision making of moderate complexity.      Note: This dictation was prepared with Dragon dictation along with smaller phrase technology. Any transcriptional errors that result from this process are unintentional.

## 2017-08-21 ENCOUNTER — Ambulatory Visit: Admitting: Family Medicine

## 2017-09-05 ENCOUNTER — Other Ambulatory Visit: Payer: Self-pay | Admitting: Family Medicine

## 2017-09-05 ENCOUNTER — Ambulatory Visit: Admitting: Family Medicine

## 2017-09-05 DIAGNOSIS — E041 Nontoxic single thyroid nodule: Secondary | ICD-10-CM

## 2017-09-11 ENCOUNTER — Other Ambulatory Visit

## 2017-09-12 ENCOUNTER — Other Ambulatory Visit

## 2017-09-15 IMAGING — MG 2D DIGITAL DIAGNOSTIC UNILATERAL RIGHT MAMMOGRAM WITH CAD AND AD
6 of 9 series · 6 of 21 positions shown · non-contrast
Comparison: Previous exam(s).

CLINICAL DATA: Patient returns today to evaluate a possible right
breast mass identified on recent screening mammogram.

EXAM:
2D DIGITAL DIAGNOSTIC RIGHT MAMMOGRAM WITH CAD AND ADJUNCT TOMO
ULTRASOUND RIGHT BREAST

[R ML synth-2D]
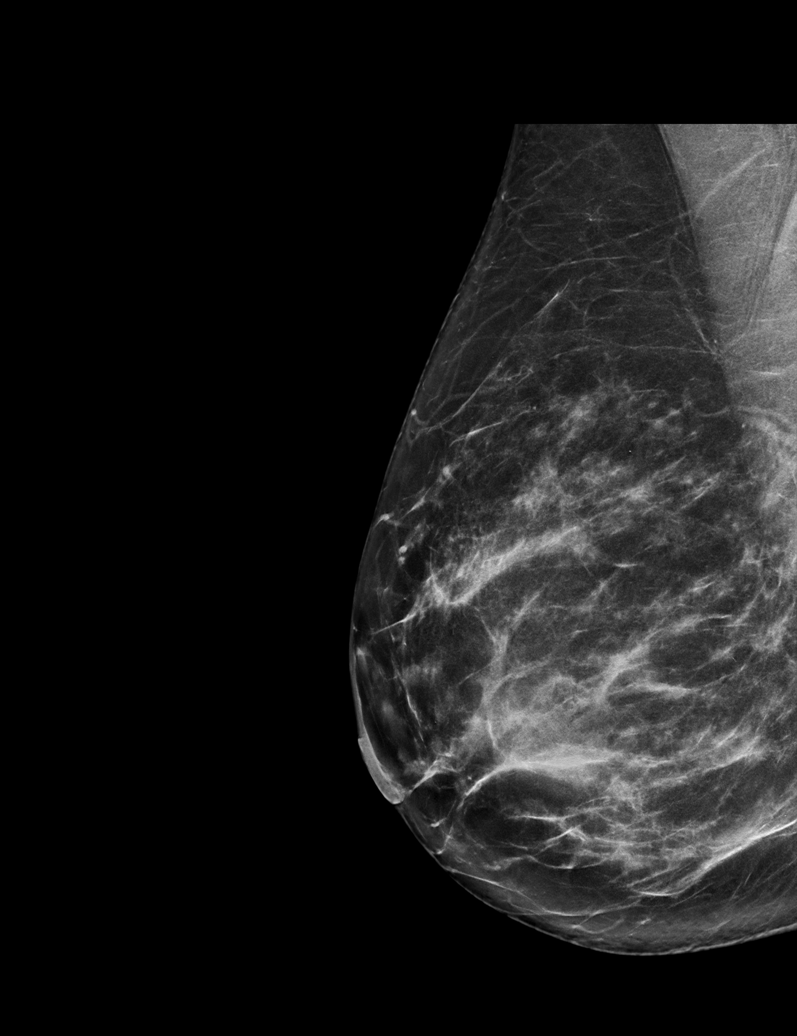

[R CC synth-2D]
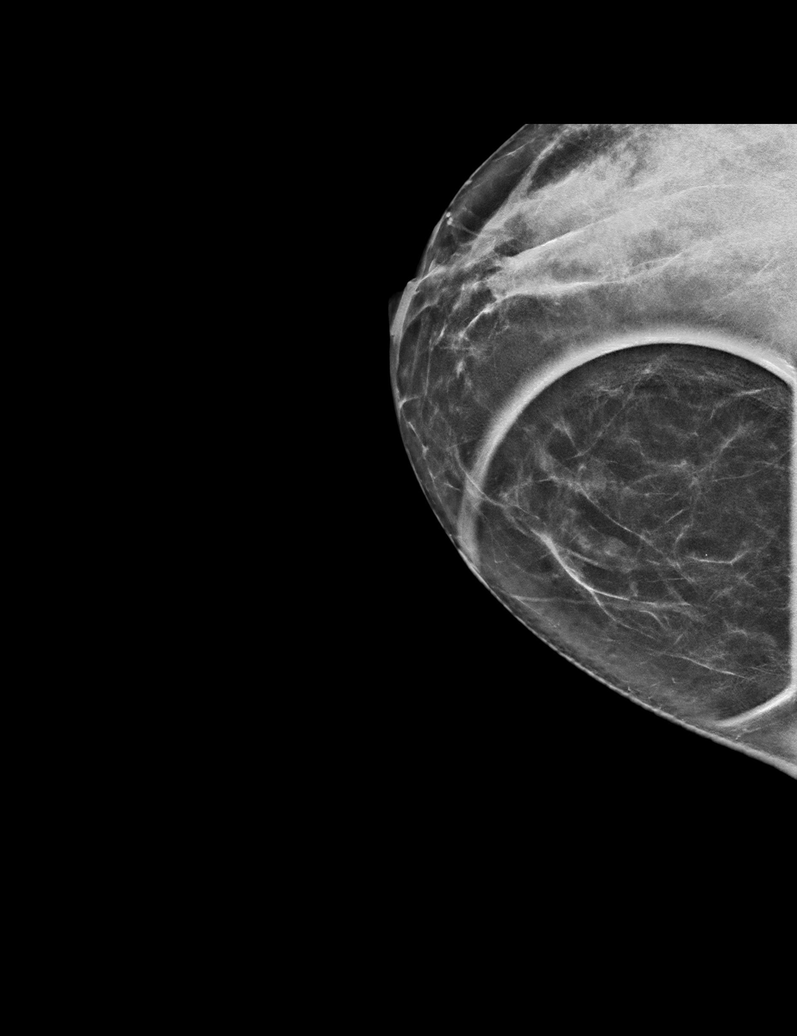

[R MLO synth-2D]
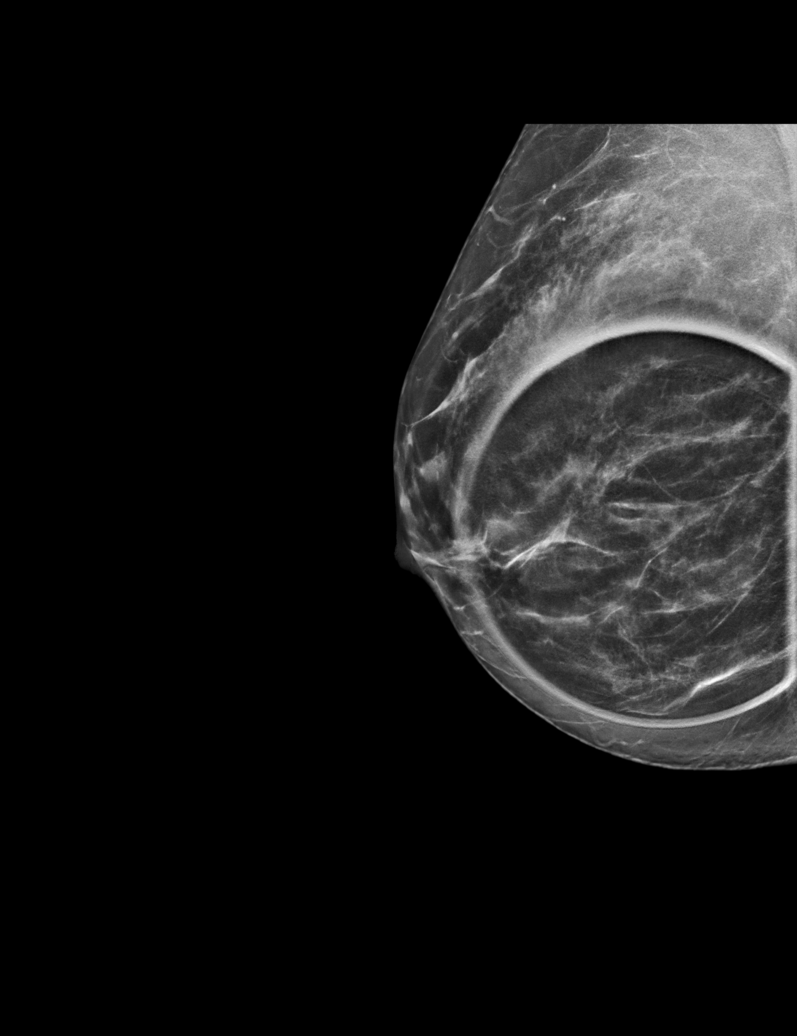

[R CC]
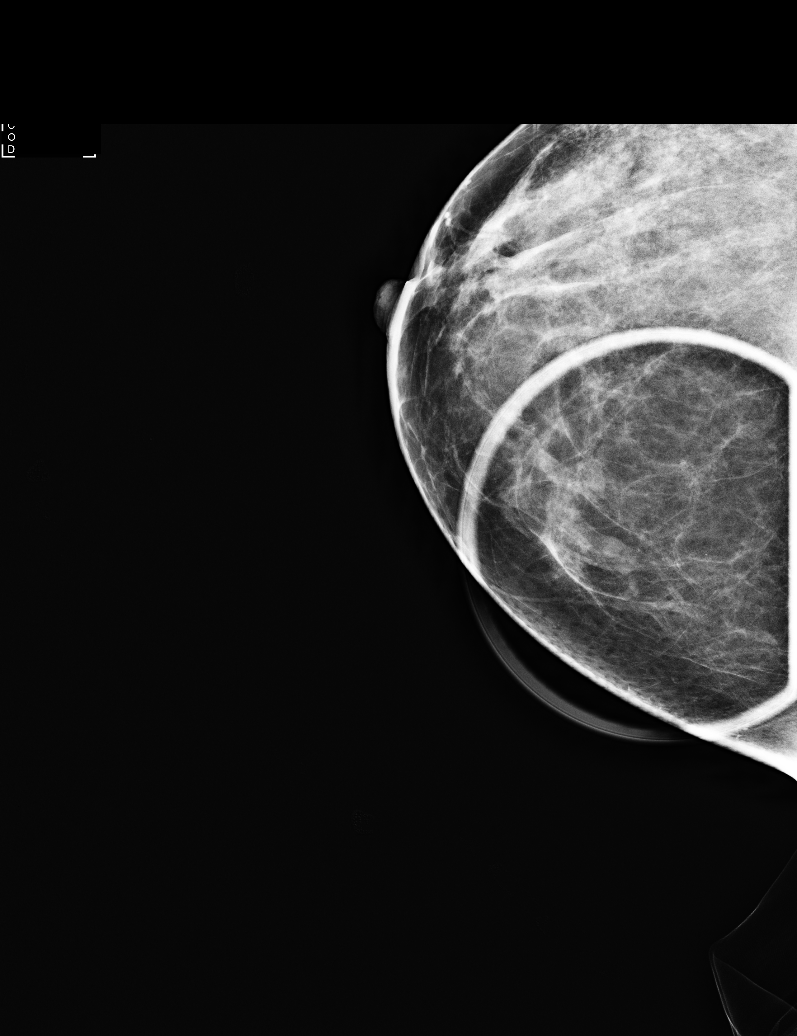

[R ML]
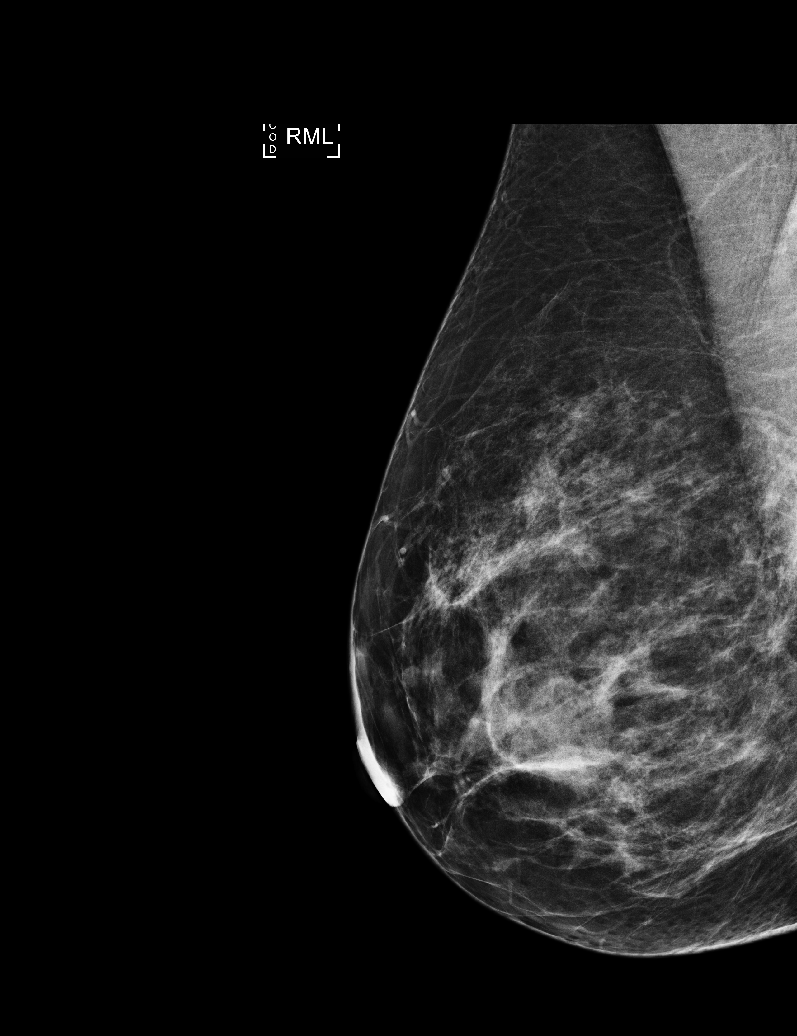

[R MLO]
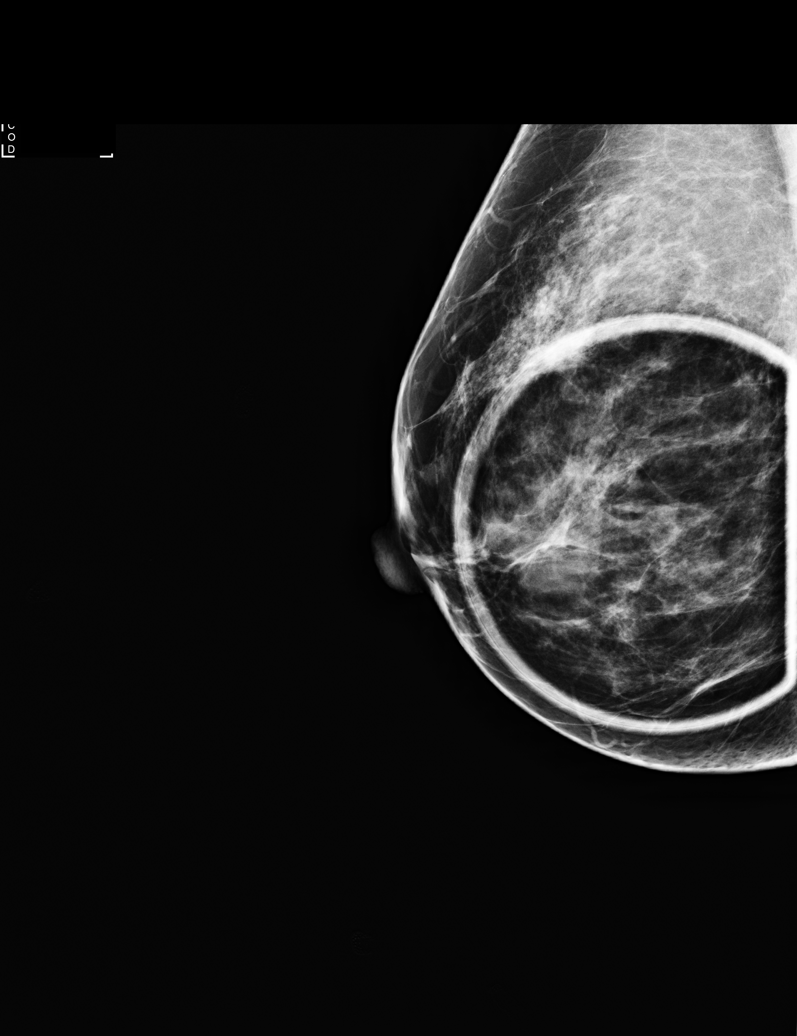

[6 of 21 positions shown; findings below may reference images not displayed]

ACR Breast Density Category c: The breast tissue is heterogeneously
dense, which may obscure small masses.
FINDINGS: On today's additional views with spot compression and 3D
tomosynthesis, an irregular mass is confirmed within the inner right
breast, at middle to posterior depth, measuring approximately 9 mm
greatest dimension, only well seen on the CC projection.

Mammographic images were processed with CAD.

Targeted ultrasound is performed, evaluating the inner right breast,
showing a probably benign cluster of cysts at the 3 o'clock axis, 3
cm from the nipple, measuring 10 x 3 x 7 mm, a probable correlate
for the mammographic finding, difficult to characterize definitively
due to shadowing caused by an adjacent ridge of normal dense
fibroglandular tissue.
IMPRESSION: Probably benign cluster of cysts within the right breast at the 3
o'clock axis, 3 cm from the nipple, measuring 10 x 3 x 7 mm, a
probable correlate for the mammographic finding. Follow-up right
breast diagnostic mammogram and ultrasound is recommended in 6
months to ensure stability.

RECOMMENDATION:
Right breast diagnostic mammogram and ultrasound in 6 months.

I have discussed the findings and recommendations with the patient.
Results were also provided in writing at the conclusion of the
visit. If applicable, a reminder letter will be sent to the patient
regarding the next appointment.

BI-RADS CATEGORY  3: Probably benign.

## 2017-09-26 ENCOUNTER — Other Ambulatory Visit

## 2017-10-18 ENCOUNTER — Ambulatory Visit
Admission: RE | Admit: 2017-10-18 | Discharge: 2017-10-18 | Disposition: A | Discharge status: Home / Self Care | Source: Ambulatory Visit | Attending: Family Medicine | Admitting: Family Medicine

## 2017-10-18 DIAGNOSIS — E041 Nontoxic single thyroid nodule: Secondary | ICD-10-CM

## 2017-10-28 ENCOUNTER — Ambulatory Visit
Admission: RE | Admit: 2017-10-28 | Discharge: 2017-10-28 | Disposition: A | Discharge status: Home / Self Care | Source: Ambulatory Visit | Attending: Physician Assistant | Admitting: Physician Assistant

## 2017-10-28 DIAGNOSIS — N6001 Solitary cyst of right breast: Secondary | ICD-10-CM

## 2017-12-16 ENCOUNTER — Other Ambulatory Visit: Payer: Self-pay

## 2017-12-16 ENCOUNTER — Encounter (HOSPITAL_COMMUNITY): Payer: Self-pay

## 2017-12-16 ENCOUNTER — Emergency Department (HOSPITAL_COMMUNITY)
Admission: EM | Admit: 2017-12-16 | Discharge: 2017-12-16 | Disposition: A | Discharge status: Home / Self Care | Attending: Emergency Medicine | Admitting: Emergency Medicine

## 2017-12-16 DIAGNOSIS — Y93B1 Activity, exercise machines primarily for muscle strengthening: Secondary | ICD-10-CM | POA: Diagnosis not present

## 2017-12-16 DIAGNOSIS — Y999 Unspecified external cause status: Secondary | ICD-10-CM | POA: Diagnosis not present

## 2017-12-16 DIAGNOSIS — Y929 Unspecified place or not applicable: Secondary | ICD-10-CM | POA: Insufficient documentation

## 2017-12-16 DIAGNOSIS — Z79899 Other long term (current) drug therapy: Secondary | ICD-10-CM | POA: Diagnosis not present

## 2017-12-16 DIAGNOSIS — S39011A Strain of muscle, fascia and tendon of abdomen, initial encounter: Secondary | ICD-10-CM

## 2017-12-16 DIAGNOSIS — X509XXA Other and unspecified overexertion or strenuous movements or postures, initial encounter: Secondary | ICD-10-CM | POA: Diagnosis not present

## 2017-12-16 DIAGNOSIS — S3991XA Unspecified injury of abdomen, initial encounter: Secondary | ICD-10-CM | POA: Diagnosis present

## 2017-12-16 LAB — URINALYSIS, ROUTINE W REFLEX MICROSCOPIC
BILIRUBIN URINE: NEGATIVE
GLUCOSE, UA: NEGATIVE mg/dL
HGB URINE DIPSTICK: NEGATIVE
KETONES UR: 5 mg/dL — AB
Leukocytes, UA: NEGATIVE
NITRITE: NEGATIVE
PH: 6 (ref 5.0–8.0)
Protein, ur: NEGATIVE mg/dL
SPECIFIC GRAVITY, URINE: 1.004 — AB (ref 1.005–1.030)

## 2017-12-16 LAB — COMPREHENSIVE METABOLIC PANEL
ALK PHOS: 42 U/L (ref 38–126)
ALT: 18 U/L (ref 14–54)
AST: 21 U/L (ref 15–41)
Albumin: 4.8 g/dL (ref 3.5–5.0)
Anion gap: 6 (ref 5–15)
BUN: 5 mg/dL — ABNORMAL LOW (ref 6–20)
CALCIUM: 9.3 mg/dL (ref 8.9–10.3)
CO2: 29 mmol/L (ref 22–32)
CREATININE: 0.68 mg/dL (ref 0.44–1.00)
Chloride: 100 mmol/L — ABNORMAL LOW (ref 101–111)
Glucose, Bld: 105 mg/dL — ABNORMAL HIGH (ref 65–99)
Potassium: 3.3 mmol/L — ABNORMAL LOW (ref 3.5–5.1)
Sodium: 135 mmol/L (ref 135–145)
Total Bilirubin: 0.6 mg/dL (ref 0.3–1.2)
Total Protein: 7.5 g/dL (ref 6.5–8.1)

## 2017-12-16 LAB — CBC
HCT: 39.7 % (ref 36.0–46.0)
Hemoglobin: 13.5 g/dL (ref 12.0–15.0)
MCH: 30.6 pg (ref 26.0–34.0)
MCHC: 34 g/dL (ref 30.0–36.0)
MCV: 90 fL (ref 78.0–100.0)
PLATELETS: 271 10*3/uL (ref 150–400)
RBC: 4.41 MIL/uL (ref 3.87–5.11)
RDW: 12.2 % (ref 11.5–15.5)
WBC: 4.1 10*3/uL (ref 4.0–10.5)

## 2017-12-16 LAB — I-STAT BETA HCG BLOOD, ED (MC, WL, AP ONLY): I-stat hCG, quantitative: <5 m[IU]/mL (ref <5)

## 2017-12-16 LAB — LIPASE, BLOOD: LIPASE: 30 U/L (ref 11–51)

## 2017-12-16 NOTE — ED Provider Notes (Signed)
[facility]-EMERGENCY DEPT Provider Note   CSN: [id number] Arrival date & time: 12/16/17  0827     History   Chief Complaint Chief Complaint  Patient presents with  . Abdominal Pain    HPI Kathryn Riggs is a 46 y.o. female.  The history is provided by the patient. No language interpreter was used.  Abdominal Pain      Kathryn Riggs is a 46 y.o. female who presents to the Emergency Department complaining of abdominal pain.  She reports 1 week of right lower quadrant abdominal pain after using a GYNe flex.  Pain is worse when she lays on her side and feels like something has pulled.  She did have a normal menstrual cycle, no current vaginal bleeding.  No fevers, vomiting, diarrhea.  She does have occasional epigastric and right upper quadrant pain.  She does feel shaky before having a bowel movement.  No dysuria.  She has not tried any ibuprofen or Aleve for this pain.  Symptoms are moderate in nature.  Past Medical History:  Diagnosis Date  . Anemia   . Laryngopharyngeal reflux (LPR)   . Migraines   . [place] spotted fever     Patient Active Problem List   Diagnosis Date Noted  . Headache 10/29/2016  . Dizziness and giddiness 10/29/2016  . Insomnia 05/14/2016  . 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 Muscle pain 04/30/2016  . Achilles tendinosis 04/30/2016    No past surgical history on file.  OB History    No data available       Home Medications    Prior to Admission medications   Medication Sig Start Date End Date Taking? Authorizing Provider  Ascorbic Acid (VITAMIN C) 1000 MG tablet Take 1,000 mg by mouth daily.   Yes [provider]  Cholecalciferol (VITAMIN D3) 1000 units CHEW Chew 1 tablet by mouth daily.   Yes [provider]  ferrous gluconate (FERGON) 324 MG tablet Take 324 mg by mouth daily with breakfast.   Yes [provider]  ibuprofen (ADVIL,MOTRIN) 200  MG tablet Take 200 mg by mouth daily as needed for moderate pain.   Yes [provider]  Multiple Vitamins-Minerals (MULTIVITAMIN ADULT) CHEW Chew 1 tablet by mouth daily.   Yes [provider]  naproxen sodium (ALEVE) 220 MG tablet Take 220 mg by mouth daily as needed (PAIN).   Yes [provider]  Omega Fatty Acids-Vitamins (OMEGA-3 GUMMIES PO) Take 1 tablet by mouth daily.   Yes [provider]  Probiotic Product (PROBIOTIC-10) CHEW Chew 1 tablet by mouth daily.   Yes [provider]  aspirin-acetaminophen-caffeine (EXCEDRIN MIGRAINE) [id number] MG tablet Take 1 tablet by mouth every 6 (six) hours as needed for headache.    [provider]  loratadine (CLARITIN) 10 MG tablet Take 10 mg by mouth daily.    [provider]  OVER THE COUNTER MEDICATION Grapefruit seed extract, japanese knotweed tincture, tumeric, red sage, scoltaria balcalenis tincture, kudzu tincture, andrographis 800mg  daily, spiro, liver regenerator, ashwanghanda 125mg  bid, biocidine dropps 4ggts bid, NAC 500mg  bid.    [provider]  topiramate (TOPAMAX) 25 MG tablet Take 1 tablet (25 mg total) by mouth 2 (two) times daily. 1 tab at hs for 1 week then 2 at night Patient not taking: Reported on 12/16/2017 10/29/16   Nilda RiggsMartin, Nancy Carolyn, NP    Family History Family History  Problem Relation Age of Onset  . Breast cancer Paternal  Aunt   . Stroke Neg Hx   . Neuropathy Neg Hx   . Multiple sclerosis Neg Hx   . Migraines Neg Hx     Social History Social History   Tobacco Use  . Smoking status: Never Smoker  . Smokeless tobacco: Never Used  Substance Use Topics  . Alcohol use: No  . Drug use: No     Allergies   Patient has no known allergies.   Review of Systems Review of Systems  Gastrointestinal: Positive for abdominal pain.  All other systems reviewed and are negative.    Physical Exam Updated Vital Signs BP 132/85 (BP Location:  Right Arm)   Pulse 79   Temp 98 F (36.7 C) (Oral)   Resp 18   LMP 12/10/2017 (Exact Date)   SpO2 100%   Physical Exam  Constitutional: She is oriented to person, place, and time. She appears well-developed and well-nourished.  HENT:  Head: Normocephalic and atraumatic.  Cardiovascular: Normal rate and regular rhythm.  No murmur heard. Pulmonary/Chest: Effort normal and breath sounds normal. No respiratory distress.  Abdominal: Soft. There is no rebound and no guarding.  Minimal right lower quadrant and inguinal tenderness without any palpable hernias or masses.  Musculoskeletal: She exhibits no edema or tenderness.  Neurological: She is alert and oriented to person, place, and time.  Skin: Skin is warm and dry.  Psychiatric: She has a normal mood and affect. Her behavior is normal.  Nursing note and vitals reviewed.    ED Treatments / Results  Labs (all labs ordered are listed, but only abnormal results are displayed) Labs Reviewed  COMPREHENSIVE METABOLIC PANEL - Abnormal; Notable for the following components:      Result Value   Potassium 3.3 (*)    Chloride 100 (*)    Glucose, Bld 105 (*)    BUN 5 (*)    All other components within normal limits  URINALYSIS, ROUTINE W REFLEX MICROSCOPIC - Abnormal; Notable for the following components:   Color, Urine STRAW (*)    Specific Gravity, Urine 1.004 (*)    Ketones, ur 5 (*)    All other components within normal limits  LIPASE, BLOOD  CBC  I-STAT BETA HCG BLOOD, ED ([facility], [facility], [facility] ONLY)    EKG  EKG Interpretation None       Radiology No results found.  Procedures Procedures (including critical care time)  Medications Ordered in ED Medications - No data to display   Initial Impression / Assessment and Plan / ED Course  I have reviewed the triage vital signs and the nursing notes.  Pertinent labs & imaging results that were available during my care of the patient were reviewed by me and considered in my medical  decision making (see chart for details).    Patient here for evaluation of 1 week of right lower quadrant pain.  She has minimal abdominal tenderness on examination with no peritoneal findings.  No evidence of hernia or mass.  Presentation is not consistent with acute appendicitis, ovarian torsion, incarcerated hernia.  Counseled patient on home care for abdominal wall strain with ibuprofen or Aleve, warm compresses and activity as tolerated.  Discussed outpatient follow-up and return precautions.  Final Clinical Impressions(s) / ED Diagnoses   Final diagnoses:  None    ED Discharge Orders    None       Tilden Fossaees, Rayhaan Huster, MD 12/16/17 1250

## 2017-12-16 NOTE — ED Triage Notes (Signed)
She c/o right-sided abd. Pain x ~ 2 weeks. She states this pain was made worse by her using an abdominal muscle exersize device. She is in no distress.

## 2018-02-23 NOTE — Progress Notes (Deleted)
  [facility] D.O[facility] [address] [place], [place] [address] Phone: [phone] Subjective:    I'm seeing this patient by the request  of:    CC: Hip pain  FAO:[id number]:Subjective  Roanna BanningLizbeth Contino is a 47 y.o. female coming in with complaint of hip pain.     Past Medical History:  Diagnosis Date  . Anemia   . Laryngopharyngeal reflux (LPR)   . Migraines   . [facility]    No past surgical history on file. Social History   Socioeconomic History  . Marital status: Married    Spouse name: Not on file  . Number of children: Not on file  . Years of education: Not on file  . Highest education level: Not on file  Social Needs  . Financial resource strain: Not on file  . Food insecurity - worry: Not on file  . Food insecurity - inability: Not on file  . Transportation needs - medical: Not on file  . Transportation needs - non-medical: Not on file  Occupational History  . Not on file  Tobacco Use  . Smoking status: Never Smoker  . Smokeless tobacco: Never Used  Substance and Sexual Activity  . Alcohol use: No  . Drug use: No  . Sexual activity: Not on file  Other Topics Concern  . Not on file  Social History Narrative  . Not on file   No Known Allergies Family History  Problem Relation Age of Onset  . Breast cancer Paternal Aunt   . Stroke Neg Hx   . Neuropathy Neg Hx   . Multiple sclerosis Neg Hx   . Migraines Neg Hx      Past medical history, social, surgical and family history all reviewed in electronic medical record.  No pertanent information unless stated regarding to the chief complaint.   Review of Systems:Review of systems updated and as accurate as of 02/23/18  No headache, visual changes, nausea, vomiting, diarrhea, constipation, dizziness, abdominal pain, skin rash, fevers, chills, night sweats, weight loss, swollen lymph nodes, body aches, joint swelling, muscle aches, chest pain, shortness of breath, mood changes.    Objective  There were no vitals taken for this visit. Systems examined below as of 02/23/18   General: No apparent distress alert and oriented x3 mood and affect normal, dressed appropriately.  HEENT: Pupils equal, extraocular movements intact  Respiratory: Patient's speak in full sentences and does not appear short of breath  Cardiovascular: No lower extremity edema, non tender, no erythema  Skin: Warm dry intact with no signs of infection or rash on extremities or on axial skeleton.  Abdomen: Soft nontender  Neuro: Cranial nerves II through XII are intact, neurovascularly intact in all extremities with 2+ DTRs and 2+ pulses.  Lymph: No lymphadenopathy of posterior or anterior cervical chain or axillae bilaterally.  Gait normal with good balance and coordination.  MSK:  Non tender with full range of motion and good stability and symmetric strength and tone of shoulders, elbows, wrist, hip, knee and ankles bilaterally.     Impression and Recommendations:     This case required medical decision making of moderate complexity.      Note: This dictation was prepared with Dragon dictation along with smaller phrase technology. Any transcriptional errors that result from this process are unintentional.

## 2018-02-24 ENCOUNTER — Ambulatory Visit: Admitting: Family Medicine

## 2018-02-26 ENCOUNTER — Encounter (HOSPITAL_COMMUNITY): Payer: Self-pay | Admitting: Family Medicine

## 2018-02-26 ENCOUNTER — Emergency Department (HOSPITAL_COMMUNITY)
Admission: EM | Admit: 2018-02-26 | Discharge: 2018-02-27 | Disposition: A | Discharge status: Home / Self Care | Attending: Emergency Medicine | Admitting: Emergency Medicine

## 2018-02-26 DIAGNOSIS — M5412 Radiculopathy, cervical region: Secondary | ICD-10-CM | POA: Insufficient documentation

## 2018-02-26 DIAGNOSIS — M542 Cervicalgia: Secondary | ICD-10-CM | POA: Diagnosis present

## 2018-02-26 DIAGNOSIS — R202 Paresthesia of skin: Secondary | ICD-10-CM | POA: Diagnosis not present

## 2018-02-26 DIAGNOSIS — R200 Anesthesia of skin: Secondary | ICD-10-CM

## 2018-02-26 DIAGNOSIS — Z79899 Other long term (current) drug therapy: Secondary | ICD-10-CM | POA: Diagnosis not present

## 2018-02-26 HISTORY — DX: Other cervical disc degeneration, unspecified cervical region: M50.30

## 2018-02-26 HISTORY — DX: Other cervical disc displacement, unspecified cervical region: M50.20

## 2018-02-26 MED ORDER — KETOROLAC TROMETHAMINE 60 MG/2ML IM SOLN
60.0000 mg | Freq: Once | INTRAMUSCULAR | Status: AC
  Administered 2018-02-27: 60 mg via INTRAMUSCULAR
  Filled 2018-02-26: qty 2

## 2018-02-26 NOTE — ED Triage Notes (Signed)
Patient reports she went to chiropractor office yesterday for history of neck conditions. After leaving, she reports she felt fine but later started having neck pain, shoulder, headache, and dizziness. Patient was seen by PCP earlier today for symptoms but the symptoms have been intermittent. Patient denies any numbness or tingling and loss of bowel or bladder. Patient ambulated to triage and back to lobby with no assistance.

## 2018-02-27 ENCOUNTER — Emergency Department (HOSPITAL_COMMUNITY)

## 2018-02-27 DIAGNOSIS — M542 Cervicalgia: Secondary | ICD-10-CM | POA: Diagnosis not present

## 2018-02-27 LAB — CBC WITH DIFFERENTIAL/PLATELET
Basophils Absolute: 0 10*3/uL (ref 0.0–0.1)
Basophils Relative: 0 %
EOS PCT: 2 %
Eosinophils Absolute: 0.1 10*3/uL (ref 0.0–0.7)
HEMATOCRIT: 35.6 % — AB (ref 36.0–46.0)
Hemoglobin: 12.1 g/dL (ref 12.0–15.0)
LYMPHS PCT: 27 %
Lymphs Abs: 1.2 10*3/uL (ref 0.7–4.0)
MCH: 29.9 pg (ref 26.0–34.0)
MCHC: 34 g/dL (ref 30.0–36.0)
MCV: 87.9 fL (ref 78.0–100.0)
MONO ABS: 0.5 10*3/uL (ref 0.1–1.0)
MONOS PCT: 12 %
NEUTROS ABS: 2.5 10*3/uL (ref 1.7–7.7)
Neutrophils Relative %: 59 %
PLATELETS: 216 10*3/uL (ref 150–400)
RBC: 4.05 MIL/uL (ref 3.87–5.11)
RDW: 12.3 % (ref 11.5–15.5)
WBC: 4.3 10*3/uL (ref 4.0–10.5)

## 2018-02-27 LAB — COMPREHENSIVE METABOLIC PANEL
ALT: 18 U/L (ref 14–54)
ANION GAP: 10 (ref 5–15)
AST: 22 U/L (ref 15–41)
Albumin: 4.2 g/dL (ref 3.5–5.0)
Alkaline Phosphatase: 35 U/L — ABNORMAL LOW (ref 38–126)
BUN: 8 mg/dL (ref 6–20)
CO2: 24 mmol/L (ref 22–32)
Calcium: 8.8 mg/dL — ABNORMAL LOW (ref 8.9–10.3)
Chloride: 98 mmol/L — ABNORMAL LOW (ref 101–111)
Creatinine, Ser: 0.59 mg/dL (ref 0.44–1.00)
Glucose, Bld: 91 mg/dL (ref 65–99)
POTASSIUM: 3.4 mmol/L — AB (ref 3.5–5.1)
Sodium: 132 mmol/L — ABNORMAL LOW (ref 135–145)
Total Bilirubin: 0.4 mg/dL (ref 0.3–1.2)
Total Protein: 6.8 g/dL (ref 6.5–8.1)

## 2018-02-27 LAB — I-STAT TROPONIN, ED: TROPONIN I, POC: 0 ng/mL (ref 0.00–0.08)

## 2018-02-27 MED ORDER — GADOBENATE DIMEGLUMINE 529 MG/ML IV SOLN
20.0000 mL | Freq: Once | INTRAVENOUS | Status: AC | PRN
  Administered 2018-02-27: 16 mL via INTRAVENOUS

## 2018-02-27 MED ORDER — LORAZEPAM 2 MG/ML IJ SOLN
2.0000 mg | Freq: Once | INTRAMUSCULAR | Status: AC
  Administered 2018-02-27: 2 mg via INTRAVENOUS
  Filled 2018-02-27: qty 1

## 2018-02-27 MED ORDER — PREDNISONE 20 MG PO TABS: ORAL_TABLET | ORAL | 0 refills | Status: DC

## 2018-02-27 MED ORDER — TRAMADOL HCL 50 MG PO TABS: 50.0000 mg | ORAL_TABLET | Freq: Four times a day (QID) | ORAL | 0 refills | Status: DC | PRN

## 2018-02-27 NOTE — ED Provider Notes (Signed)
[facility]-EMERGENCY DEPT Provider Note   CSN: [id number] Arrival date & time: 02/26/18  1348   Time seen 23:22 PM  History   Chief Complaint Chief Complaint  Patient presents with  . Neck Pain    HPI Kathryn Riggs is a 47 y.o. female.  HPI patient states she has known degenerative disc disease of her cervical spine.  She was seen by Dr. Yetta BarreJones, neurosurgeon in October and November when she had a flareup of problems of her neck.  He recommended she get injections which she did not want to do, she preferred to do physical therapy.  She states she seen a chiropractor twice in the past and they mainly worked on her legs.  However yesterday she was seen and this time they did "active release" on both sides of her neck.  She states her head was moved to the sides.  She did not feel a pop or experience any pain.  When she left she felt fine.  Later on she was bending over washing her hands and she felt faint and dizzy for a second.  She then started noticing that both of her little fingers were tingling that lasted 5-6 hours.  She states she had trouble sleeping because she was scared.  She describes a pressure in the back of her head where her head sits on her cervical spine.  She had called her doctor yesterday after not feeling well and had an appointment this morning.  And she was feeling okay at that time.  She states later she went for a walk and states her feet felt "sluggish" and she was still having dizziness.  She states she was not dragging her feet.  She states they were "not moving normally".  She states she feels unbalanced.  She denies stumbling or falling.  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  [facility]  She had a CT of her face done February 19 there and they said she had a lot of arthritis in her TMJ and her cervical spine.  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  She has been prescribed Flexeril as needed which she did not take today.  She denies nausea or vomiting but states she had diarrhea once.  She denies any visual changes or numbness.  Please note patient uses medical terms very easily although she does not work in the [employer].   She states she has had MRI in 2013 showing mild stenosis of C5-6 and in 2017 it was described as moderate.  She has never had surgery on her cervical spine.  She states she has herniated disks from C4-C7.   PCP Ladora DanielBeal, Sheri, PA-C   Past Medical History:  Diagnosis Date  . Anemia   . DDD (degenerative disc disease), cervical   . Herniated disc, cervical   . Laryngopharyngeal reflux (LPR)   . Migraines   . [place] spotted fever     Patient Active Problem List   Diagnosis Date Noted  . Headache 10/29/2016  . Dizziness and giddiness 10/29/2016  . Insomnia 05/14/2016  . 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 Muscle pain 04/30/2016  . Achilles tendinosis 04/30/2016    History reviewed. No pertinent surgical history.  OB History  No data available       Home Medications    Prior to Admission medications   Medication Sig Start Date End Date Taking? Authorizing Provider  Ascorbic Acid (VITAMIN C) 1000 MG tablet Take 1,000 mg by mouth daily.   Yes [provider]  aspirin-acetaminophen-caffeine (EXCEDRIN MIGRAINE) [phone] MG tablet Take 1 tablet by mouth every 6 (six) hours as needed for headache.   Yes [provider]  Cholecalciferol (VITAMIN D3) 1000 units CHEW Chew 1 tablet by mouth daily.   Yes [provider]  cyclobenzaprine (FLEXERIL) 5 MG tablet Take 5 mg by mouth 3 (three) times daily as needed for muscle spasms.   Yes [provider]  ferrous gluconate (FERGON) 324 MG tablet Take 324 mg by mouth daily  with breakfast.   Yes [provider]  Hyaluronic Acid-Vitamin C (HYALURONIC ACID PO) Take 1 capsule by mouth daily.   Yes [provider]  ibuprofen (ADVIL,MOTRIN) 200 MG tablet Take 200 mg by mouth daily as needed for moderate pain.   Yes [provider]  Multiple Vitamins-Minerals (MULTIVITAMIN ADULT) CHEW Chew 1 tablet by mouth daily.   Yes [provider]  Omega Fatty Acids-Vitamins (OMEGA-3 GUMMIES PO) Take 1 tablet by mouth daily.   Yes [provider]  OVER THE COUNTER MEDICATION Grapefruit seed extract, japanese knotweed tincture, tumeric, red sage, scoltaria balcalenis tincture, kudzu tincture, andrographis 800mg  daily, spiro, liver regenerator, ashwanghanda 125mg  bid, biocidine dropps 4ggts bid, NAC 500mg  bid.   Yes [provider]  Probiotic Product (PROBIOTIC-10) CHEW Chew 1 tablet by mouth daily.   Yes [provider]  topiramate (TOPAMAX) 25 MG tablet Take 1 tablet (25 mg total) by mouth 2 (two) times daily. 1 tab at hs for 1 week then 2 at night Patient not taking: Reported on 12/16/2017 10/29/16   [name], NP    Family History Family History  Problem Relation Age of Onset  . Breast cancer Paternal Aunt   . Stroke Neg Hx   . Neuropathy Neg Hx   . Multiple sclerosis Neg Hx   . Migraines Neg Hx     Social History Social History   Tobacco Use  . Smoking status: Never Smoker  . Smokeless tobacco: Never Used  Substance Use Topics  . Alcohol use: No  . Drug use: No  lives with spouse Unemployed, was working in recreation in the [employer]   Allergies   Patient has no known allergies.   Review of Systems Review of Systems  All other systems reviewed and are negative.    Physical Exam Updated Vital Signs BP 123/86   Pulse 81   Temp 98.4 F (36.9 C) (Oral)   Resp 19   Ht 5\' 8"  (1.727 m)   Wt 77.6 kg (171 lb)   LMP 02/25/2018   SpO2 99%   BMI 26.00 kg/m   Vital signs normal     Physical Exam  Constitutional: She is oriented to person, place, and time. She appears well-developed and well-nourished.  Non-toxic appearance. She does not appear ill. No distress.  HENT:  Head: Normocephalic and atraumatic.  Right Ear: External ear normal.  Left Ear: External ear normal.  Nose: Nose normal. No mucosal edema or rhinorrhea.  Mouth/Throat: Oropharynx is clear and moist and mucous membranes are normal. No dental abscesses or uvula swelling.  Eyes: Conjunctivae and EOM are normal. Pupils are equal, round, and reactive to light.  Neck: Normal range of motion and full passive  range of motion without pain. Neck supple.    Patient is only tender at the base of the skull/upper cervical spine.  She is nontender to palpation of the midline cervical spine or the paraspinous muscles.  Please note patient uses terms such as trapezius and scalene muscles.  Cardiovascular: Normal rate, regular rhythm and normal heart sounds. Exam reveals no gallop and no friction rub.  No murmur heard. Pulmonary/Chest: Effort normal and breath sounds normal. No respiratory distress. She has no wheezes. She has no rhonchi. She has no rales. She exhibits no tenderness and no crepitus.  Abdominal: Soft. Normal appearance and bowel sounds are normal. She exhibits no distension. There is no tenderness. There is no rebound and no guarding.  Musculoskeletal: Normal range of motion. She exhibits no edema or tenderness.  Moves all extremities well.   Neurological: She is alert and oriented to person, place, and time. She has normal strength. No cranial nerve deficit.  Patient will not cooperate to test the strength in her upper extremities or lower extremities because "I am afraid".  Her patellar reflexes are 3+ and equal bilaterally.  Patient was observed to be walking in the room and her gait appears to be normal.  She is using her upper extremities normally.  I do notice she is moving her head from side to  side at times without difficulty.  Skin: Skin is warm, dry and intact. No rash noted. No erythema. No pallor.  Psychiatric: She has a normal mood and affect. Her speech is normal and behavior is normal. Her mood appears not anxious.  Nursing note and vitals reviewed.    ED Treatments / Results  Labs (all labs ordered are listed, but only abnormal results are displayed) Labs Reviewed  CBC WITH DIFFERENTIAL/PLATELET  COMPREHENSIVE METABOLIC PANEL  I-STAT TROPONIN, ED    EKG  EKG Interpretation None       Radiology No results found.  Procedures Procedures (including critical care time)  Medications Ordered in ED Medications  ketorolac (TORADOL) injection 60 mg (60 mg Intramuscular Given 02/27/18 0031)     Initial Impression / Assessment and Plan / ED Course  I have reviewed the triage vital signs and the nursing notes.  Pertinent labs & imaging results that were available during my care of the patient were reviewed by me and considered in my medical decision making (see chart for details).       MRIs not available here at night, initially talked to the patient about getting CT studies however she requests getting MRI studies.  She states she is willing to wait to have that done.  Patient was given Toradol while waiting for her studies to be done.  01:42 AM Dr Bebe Shaggy at [facility] aware of need for MR studies.  Patient was sent back from [facility] because they were 11 patients ahead of her waiting for MRI.  Is aware and is agreeable to waiting in the ED to get the MRI when they come in this morning.  Patient was turned over to Dr. Silverio Lay to get the results of her MR I/MRA  Final Clinical Impressions(s) / ED Diagnoses   Final diagnoses:  Neck pain  Balance problem  Tingling of upper extremity    Disposition pending  Devoria Albe, MD, Concha Pyo, MD 02/27/18 [phone]

## 2018-02-27 NOTE — ED Notes (Signed)
All jewelry removed for MRI and placed in pink denture cup that went home with husband.

## 2018-02-27 NOTE — ED Provider Notes (Signed)
  Physical Exam  BP 126/80 (BP Location: Left Arm)   Pulse 67   Temp 98.4 F (36.9 C) (Oral)   Resp 16   Ht 5\' 8"  (1.727 m)   Wt 77.6 kg (171 lb)   LMP 02/25/2018   SpO2 100%   BMI 26.00 kg/m   Physical Exam  ED Course/Procedures     Procedures  MDM  Patient care assumed at 8 am. Patient had recent chiropractor manipulation and now has little finger tingling and leg heaviness. MRI head/neck and MRA head/neck ordered to r/o dissection vs stroke. Sign out pending results.   2:03 PM MRI brain unremarkable. MR cervical spine showed some disc protrusions but no spinal cord compression. No dissection evident. She is on flexeril. I discussed starting steroids vs cortisone injection. She had a friend with chronic lyme and is hesitant about prednisone. She states that she is willing to get a prescription of prednisone and ultram and will discuss results with Dr. Yetta BarreJones.      Charlynne PanderYao, David Hsienta, MD 02/27/18 [phone]

## 2018-02-27 NOTE — ED Notes (Signed)
PATIENT REQUESTED THAT LAB COLLECTION BE TAKEN FROM IV INSTEAD OF BEING STUCK AGAIN

## 2018-02-27 NOTE — ED Notes (Signed)
[facility] transported [facility],waiting on pt to return for vitals

## 2018-02-27 NOTE — Discharge Instructions (Signed)
Take ultram as needed for pain.   Take prednisone as prescribed.   Discuss MRI results with Dr. Yetta BarreJones. Consider taking steroids or getting cortisone injections or continue physical therapy   Return to ER if you have worse numbness or weakness, trouble walking, headaches, vomiting.

## 2018-02-27 NOTE — ED Notes (Signed)
[facility] transported [facility]

## 2018-02-27 NOTE — ED Notes (Addendum)
[facility]. [facility] charge nurse notified about patient.

## 2018-03-16 ENCOUNTER — Encounter (HOSPITAL_COMMUNITY): Payer: Self-pay | Admitting: Emergency Medicine

## 2018-03-16 ENCOUNTER — Ambulatory Visit (HOSPITAL_COMMUNITY): Admission: EM | Admit: 2018-03-16 | Discharge: 2018-03-16 | Disposition: A | Discharge status: Home / Self Care

## 2018-03-16 DIAGNOSIS — R6884 Jaw pain: Secondary | ICD-10-CM

## 2018-03-16 DIAGNOSIS — G8929 Other chronic pain: Secondary | ICD-10-CM

## 2018-03-16 NOTE — ED Triage Notes (Signed)
Pt reports she has TMJ and went to her PCP for a needle injection, has had this procedure done 30+ times but Thursday her L jaw is painful and swollen.

## 2018-03-16 NOTE — ED Provider Notes (Signed)
03/16/2018 1:05 PM   DOB: 1971-03-14 / MRN: [id number]  SUBJECTIVE:  Kathryn BanningLizbeth Chaves is a 47 y.o. female presenting for left-sided jaw pain that started after dry needling 2 days ago.  Patient has a history of TMJ and has multiple specialist, and she has an appointment with her dentist who is also a specialist in this area tomorrow.  She is here because they know about nursing triage service advised that she come in to be seen.  She denies difficulty swallowing, throat swelling, teeth pain.  She denies ear pain.  She is tried Tylenol with mild relief.  She has No Known Allergies.   She  has a past medical history of Anemia, DDD (degenerative disc disease), cervical, Herniated disc, cervical, Laryngopharyngeal reflux (LPR), Migraines, and [facility].    She  reports that she has never smoked. She has never used smokeless tobacco. She reports that she does not drink alcohol or use drugs. She  has no sexual activity history on file. The patient  has no past surgical history on file.  Her family history includes Breast cancer in her paternal aunt.  Review of Systems  Constitutional: Negative for chills, diaphoresis and fever.  HENT: Negative for congestion, ear discharge, ear pain, sinus pain and sore throat.   Eyes: Negative.   Respiratory: Negative for cough, hemoptysis, sputum production, shortness of breath and wheezing.   Cardiovascular: Negative for chest pain, orthopnea and leg swelling.  Gastrointestinal: Negative for nausea.  Skin: Negative for rash.  Neurological: Negative for dizziness, sensory change, speech change, focal weakness and headaches.    OBJECTIVE:  BP (!) 143/81   Pulse 78   Temp 98.2 F (36.8 C)   Resp 18   LMP 02/25/2018   SpO2 100%   Physical Exam  Constitutional: She is active.  Non-toxic appearance.  HENT:  Head:    Cardiovascular: Normal rate.  Pulmonary/Chest: Effort normal. No tachypnea.  Neurological: She is alert.  Skin: Skin  is warm and dry. She is not diaphoretic. No pallor.    No results found for this or any previous visit (from the past 72 hour(s)).  No results found.  ASSESSMENT AND PLAN:  No orders of the defined types were placed in this encounter.    Chronic jaw pain: Acute on chronic.  No red flags on exam advised Tylenol 1000 mg every 8 hours as needed.  She will follow-up with her dentist tomorrow morning.      The patient is advised to call or return to clinic if she does not see an improvement in symptoms, or to seek the care of the closest emergency department if she worsens with the above plan.   Deliah BostonMichael Orey Moure, MHS, PA-C 03/16/2018 1:05 PM    Ofilia Neaslark, Nyjah Schwake L, PA-C 03/16/18 1305

## 2018-03-23 NOTE — Progress Notes (Deleted)
[name] [address] [place], [place] [address] Phone: [phone] Subjective:    I'm seeing this patient by the request  of:  Ladora Daniel, PA-C   CC: Hip pain  [id number]  Kathryn Riggs is a 47 y.o. female coming in with complaint of hip pain.  Reviewed patient's chart patient has been in the emergency department multiple times over the course of several months.  All different complaints.    Recently an MRI of the neck done.  MRI cervical spine February 27, 2018 independently visualized by me showing a non-compression central disc protrusion from C4 through C7.  Past Medical History:  Diagnosis Date  . Anemia   . DDD (degenerative disc disease), cervical   . Herniated disc, cervical   . Laryngopharyngeal reflux (LPR)   . Migraines   . [facility] spotted fever    No past surgical history on file. Social History   Socioeconomic History  . Marital status: Married    Spouse name: Not on file  . Number of children: Not on file  . Years of education: Not on file  . Highest education level: Not on file  Occupational History  . Not on file  Social Needs  . Financial resource strain: Not on file  . Food insecurity:    Worry: Not on file    Inability: Not on file  . Transportation needs:    Medical: Not on file    Non-medical: Not on file  Tobacco Use  . Smoking status: Never Smoker  . Smokeless tobacco: Never Used  Substance and Sexual Activity  . Alcohol use: No  . Drug use: No  . Sexual activity: Not on file  Lifestyle  . Physical activity:    Days per week: Not on file    Minutes per session: Not on file  . Stress: Not on file  Relationships  . Social connections:    Talks on phone: Not on file    Gets together: Not on file    Attends religious service: Not on file    Active member of club or organization: Not on file    Attends meetings of clubs or organizations: Not on file    Relationship status: Not on file    Other Topics Concern  . Not on file  Social History Narrative  . Not on file   No Known Allergies Family History  Problem Relation Age of Onset  . Breast cancer Paternal Aunt   . Stroke Neg Hx   . Neuropathy Neg Hx   . Multiple sclerosis Neg Hx   . Migraines Neg Hx      Past medical history, social, surgical and family history all reviewed in electronic medical record.  No pertanent information unless stated regarding to the chief complaint.   Review of Systems:Review of systems updated and as accurate as of 03/23/18  No headache, visual changes, nausea, vomiting, diarrhea, constipation, dizziness, abdominal pain, skin rash, fevers, chills, night sweats, weight loss, swollen lymph nodes, body aches, joint swelling, muscle aches, chest pain, shortness of breath, mood changes.   Objective  Last menstrual period 02/25/2018. Systems examined below as of 03/23/18   General: No apparent distress alert and oriented x3 mood and affect normal, dressed appropriately.  HEENT: Pupils equal, extraocular movements intact  Respiratory: Patient's speak in full sentences and does not appear short of breath  Cardiovascular: No lower extremity edema, non tender, no erythema  Skin: Warm dry intact with no  signs of infection or rash on extremities or on axial skeleton.  Abdomen: Soft nontender  Neuro: Cranial nerves II through XII are intact, neurovascularly intact in all extremities with 2+ DTRs and 2+ pulses.  Lymph: No lymphadenopathy of posterior or anterior cervical chain or axillae bilaterally.  Gait normal with good balance and coordination.  MSK:  Non tender with full range of motion and good stability and symmetric strength and tone of shoulders, elbows, wrist, hip, knee and ankles bilaterally.     Impression and Recommendations:     This case required medical decision making of moderate complexity.      Note: This dictation was prepared with Dragon dictation along with smaller  phrase technology. Any transcriptional errors that result from this process are unintentional.

## 2018-03-24 ENCOUNTER — Ambulatory Visit: Admitting: Family Medicine

## 2018-09-13 ENCOUNTER — Emergency Department (INDEPENDENT_AMBULATORY_CARE_PROVIDER_SITE_OTHER)
Admission: EM | Admit: 2018-09-13 | Discharge: 2018-09-13 | Disposition: A | Discharge status: Home / Self Care | Source: Home / Self Care | Attending: Family Medicine | Admitting: Family Medicine

## 2018-09-13 ENCOUNTER — Encounter: Payer: Self-pay | Admitting: Emergency Medicine

## 2018-09-13 ENCOUNTER — Other Ambulatory Visit: Payer: Self-pay

## 2018-09-13 DIAGNOSIS — L089 Local infection of the skin and subcutaneous tissue, unspecified: Secondary | ICD-10-CM

## 2018-09-13 DIAGNOSIS — R591 Generalized enlarged lymph nodes: Secondary | ICD-10-CM

## 2018-09-13 MED ORDER — CEPHALEXIN 500 MG PO CAPS: 500.0000 mg | ORAL_CAPSULE | Freq: Two times a day (BID) | ORAL | 0 refills | Status: DC

## 2018-09-13 MED ORDER — MUPIROCIN 2 % EX OINT: TOPICAL_OINTMENT | CUTANEOUS | 0 refills | Status: DC

## 2018-09-13 NOTE — ED Provider Notes (Signed)
[facility]    CSN: [id number] Arrival date & time: 09/13/18  1332     History   Chief Complaint Chief Complaint  Patient presents with  . Mass    paplpable knot behind right ear    HPI Kathryn Riggs is a 47 y.o. female.   HPI  Kathryn Riggs is a 47 y.o. female presenting to UC with c/o a red tender sore behind her Right ear for the last 1 week, gradually worsening. Pt reports [facility] years [facility]. She was treated with 10 days of doxycycline but has had head and neck pain intermittently since then.  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 She has not been retreated with doxycycline because she is concerned about the side effects. She does have a f/u with infectious disease in about 2 weeks but was concerned the mass behind her ear continuing to worsen. Denies fever, chills, n/v/d. She has applied two doses of clindamycin ointment her daughter uses for acne but she is concerned it has dried her skin and made the irritation worse rather than better. No known insect bites. Denies pain inside her ear. No change in hearing. No congestion.     Past Medical History:  Diagnosis Date  . Anemia   . DDD (degenerative disc disease), cervical   . Herniated disc, cervical   . Laryngopharyngeal reflux (LPR)   . Migraines   . [place] spotted fever     Patient Active Problem List   Diagnosis Date Noted  . Headache 10/29/2016  . Dizziness and giddiness 10/29/2016  . Insomnia 05/14/2016  . 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 Muscle pain 04/30/2016  . Achilles tendinosis 04/30/2016    Past Surgical History:  Procedure Laterality Date  . epiosotomy  2007    OB History   None      Home Medications    Prior to Admission medications   Medication Sig Start Date End Date Taking? Authorizing Provider  Ascorbic Acid (VITAMIN C) 1000 MG tablet Take 1,000 mg by mouth  daily.    [provider]  aspirin-acetaminophen-caffeine (EXCEDRIN MIGRAINE) [id number] MG tablet Take 1 tablet by mouth every 6 (six) hours as needed for headache.    [provider]  cephALEXin (KEFLEX) 500 MG capsule Take 1 capsule (500 mg total) by mouth 2 (two) times daily. 09/13/18   Lurene ShadowPhelps, Cord Wilczynski O, PA-C  Cholecalciferol (VITAMIN D3) 1000 units CHEW Chew 1 tablet by mouth daily.    [provider]  cyclobenzaprine (FLEXERIL) 5 MG tablet Take 5 mg by mouth 3 (three) times daily as needed for muscle spasms.    [provider]  ferrous gluconate (FERGON) 324 MG tablet Take 324 mg by mouth daily with breakfast.    [provider]  Hyaluronic Acid-Vitamin C (HYALURONIC ACID PO) Take 1 capsule by mouth daily.    [provider]  ibuprofen (ADVIL,MOTRIN) 200 MG tablet Take 200 mg by mouth daily as needed for moderate pain.    [provider]  Multiple Vitamins-Minerals (MULTIVITAMIN ADULT) CHEW Chew 1 tablet by mouth daily.    [provider]  mupirocin ointment (BACTROBAN) 2 % Apply to wound 3 times daily for 5 days 09/13/18   Lurene ShadowPhelps, Randalyn Ahmed O, PA-C  Omega Fatty Acids-Vitamins (OMEGA-3 GUMMIES PO) Take 1 tablet by mouth daily.    [provider]  OVER THE COUNTER MEDICATION Grapefruit seed extract, japanese knotweed tincture, tumeric,  red sage, scoltaria balcalenis tincture, kudzu tincture, andrographis 800mg  daily, spiro, liver regenerator, ashwanghanda 125mg  bid, biocidine dropps 4ggts bid, NAC 500mg  bid.    [provider]  predniSONE (DELTASONE) 20 MG tablet Take 60 mg daily x 2 days then 40 mg daily x 2 days then 20 mg daily x 2 days 02/27/18   Charlynne Pander, MD  Probiotic Product (PROBIOTIC-10) CHEW Chew 1 tablet by mouth daily.    [provider]  topiramate (TOPAMAX) 25 MG tablet Take 1 tablet (25 mg total) by mouth 2 (two) times daily. 1 tab at hs for 1 week then 2 at night Patient not taking:  Reported on 12/16/2017 10/29/16   [name], NP  traMADol (ULTRAM) 50 MG tablet Take 1 tablet (50 mg total) by mouth every 6 (six) hours as needed. 02/27/18   Charlynne Pander, MD    Family History Family History  Problem Relation Age of Onset  . Breast cancer Paternal Aunt   . Stroke Neg Hx   . Neuropathy Neg Hx   . Multiple sclerosis Neg Hx   . Migraines Neg Hx     Social History Social History   Tobacco Use  . Smoking status: Never Smoker  . Smokeless tobacco: Never Used  Substance Use Topics  . Alcohol use: No  . Drug use: No     Allergies   Patient has no known allergies.   Review of Systems Review of Systems  Constitutional: Negative for chills, diaphoresis, fatigue and fever.  HENT: Positive for ear pain (behind Right ear). Negative for congestion and sore throat.   Gastrointestinal: Negative for nausea.  Skin: Positive for color change. Negative for wound.     Physical Exam Triage Vital Signs ED Triage Vitals  Enc Vitals Group     BP 09/13/18 1402 135/86     Pulse Rate 09/13/18 1402 65     Resp --      Temp 09/13/18 1402 98.6 F (37 C)     Temp Source 09/13/18 1402 Oral     SpO2 09/13/18 1402 99 %     Weight 09/13/18 1404 173 lb 6.4 oz (78.7 kg)     Height --      Head Circumference --      Peak Flow --      Pain Score 09/13/18 1403 2     Pain Loc --      Pain Edu? --      Excl. in GC? --    No data found.  Updated Vital Signs BP 135/86 (BP Location: Right Arm)   Pulse 65   Temp 98.6 F (37 C) (Oral)   Wt 173 lb 6.4 oz (78.7 kg)   SpO2 99%   BMI 26.37 kg/m   Visual Acuity Right Eye Distance:   Left Eye Distance:   Bilateral Distance:    Right Eye Near:   Left Eye Near:    Bilateral Near:     Physical Exam  Constitutional: She is oriented to person, place, and time. She appears well-developed and well-nourished.  HENT:  Head: Normocephalic and atraumatic.    Right Ear: Tympanic membrane and ear canal normal.    Left Ear: Tympanic membrane, external ear and ear canal normal.  Nose: Nose normal.  Mouth/Throat: Uvula is midline, oropharynx is clear and moist and mucous membranes are normal.  Behind Right ear: 3mm pustule along hairline. Scant crusty yellow discharge. Mildly tender. Just inferior to pustule 1cm erythematous tender, mobile  mass c/w lymphadenopathy.   Eyes: EOM are normal.  Neck: Normal range of motion.  Cardiovascular: Normal rate.  Pulmonary/Chest: Effort normal.  Musculoskeletal: Normal range of motion.  Neurological: She is alert and oriented to person, place, and time.  Skin: Skin is warm and dry.  Psychiatric: She has a normal mood and affect. Her behavior is normal.  Nursing note and vitals reviewed.    UC Treatments / Results  Labs (all labs ordered are listed, but only abnormal results are displayed) Labs Reviewed - No data to display  EKG None  Radiology No results found.  Procedures Procedures (including critical care time)  Medications Ordered in UC Medications - No data to display  Initial Impression / Assessment and Plan / UC Course  I have reviewed the triage vital signs and the nursing notes.  Pertinent labs & imaging results that were available during my care of the patient were reviewed by me and considered in my medical decision making (see chart for details).     Hx and exam c/w skin pustule and likely local reactive lymph node. Pt declined taking doxycycline to treat for possible RMSF and the skin infection. Offered Keflex. Pt also hesitant to try any other oral antibiotic unless absolutely necessary. Will have pt start using mupirocin ointment, if not improving, encouraged to take the keflex as prescribed. F/u with PCP   Final Clinical Impressions(s) / UC Diagnoses   Final diagnoses:  Skin pustule  Lymphadenopathy     Discharge Instructions      Keep area clean with warm water and mild soap. Apply antibiotic ointment as prescribed.   You may also take the oral antibiotic as prescribed to help treat the skin infection. Do not apply any additional over the counter creams or ointment or other medications on the area as this may cause skin irritation.  Please follow up with family medicine or dermatology in 1 week if not improving, sooner if significantly worsening.     ED Prescriptions    Medication Sig Dispense Auth. Provider   mupirocin ointment (BACTROBAN) 2 % Apply to wound 3 times daily for 5 days 22 g Wylodean Shimmel O, PA-C   cephALEXin (KEFLEX) 500 MG capsule Take 1 capsule (500 mg total) by mouth 2 (two) times daily. 14 capsule Lurene Shadow, PA-C     Controlled Substance Prescriptions [place] Controlled Substance Registry consulted? Not Applicable   Rolla Plate 09/14/18 1119

## 2018-09-13 NOTE — Discharge Instructions (Signed)
°  Keep area clean with warm water and mild soap. Apply antibiotic ointment as prescribed.  You may also take the oral antibiotic as prescribed to help treat the skin infection. Do not apply any additional over the counter creams or ointment or other medications on the area as this may cause skin irritation.  Please follow up with family medicine or dermatology in 1 week if not improving, sooner if significantly worsening.

## 2018-09-14 ENCOUNTER — Telehealth: Payer: Self-pay | Admitting: *Deleted

## 2018-09-14 NOTE — Telephone Encounter (Signed)
Callback: No answer, LMOM f/u from visit. Call back as needed. 

## 2018-10-14 ENCOUNTER — Ambulatory Visit (INDEPENDENT_AMBULATORY_CARE_PROVIDER_SITE_OTHER): Admitting: Sports Medicine

## 2018-10-14 ENCOUNTER — Encounter: Payer: Self-pay | Admitting: Sports Medicine

## 2018-10-14 DIAGNOSIS — G4763 Sleep related bruxism: Secondary | ICD-10-CM

## 2018-10-14 DIAGNOSIS — M791 Myalgia, unspecified site: Secondary | ICD-10-CM

## 2018-10-14 DIAGNOSIS — R42 Dizziness and giddiness: Secondary | ICD-10-CM

## 2018-10-14 NOTE — Assessment & Plan Note (Signed)
This might have been trigger for TMJ disc issues rather than Hypermobility

## 2018-10-14 NOTE — Progress Notes (Signed)
CC: Pain in neck and trapezius MM and ? If related to Lorinda Creed  Patient has been seen for orthopedic issues a [place] [facility] and has seen Dr. Penni Bombard there as well as other MDs.  She has been classified as EDS HM type.    HX is that up until age 47 ran 49Ks and did exercise classes without much problems. No childhood Hx of dislocations or subluxations.  Some periodic PFS in both knees.  In 2013 she and husband were stationed in [place].  There [place].  She never had a clear cut infectious illness but did start experiencing new sxs which were very troublesome.  Developed color change particularly in RT foot and intermittently in left foot. Bluish color. Pain in calf on RT and gradually on left.  Difficulty walking. Stopped running. Evaluated and CRPS considered but did not fit criteria.  This was start of a number of medical issues.  Considered for POTS because of postural dizziness but did not meet diagnostic criteria.  Chronic irritable bowel type sxs and more recently esophageal dysphagea.  Neck pain and NS evaluation suggests some rotation of C2 vertebrae.  Has current meniscal dislocation at TMJ RT and LT and chronic Hx of bruxism  Excess fatigue and over time has tried supplements and a number of medicines.  Now trying to stop all medicines.  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  Assessed by ID and has abnormal IGM levels.  She also has abnormal complement levels.  Low ferritin and Fe deficiency.  ID feels she may need evaluation for unusual tropical disease based on Hx and [place] when sxs started. Hx of tape worm and giardea infections.  Came to me as I see a lot of EDS.  Teleconsult with physician in [place]. 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  She would like my opinion of this.  Past Hx; Note extensive documentation of multiple medical evaluations as documented in her EMR. These are reviewed and summary of active problem considerations  above  Fam Hx No significant Hx of hypermobility or EDS in immediate relatives/  Uncle with aneurysm?  ROS Chronic neck pain Tightness in trapezius MM Dizziness dificulty swallowing Stress with caring for 2 kids while dealing with lots of medical issues and evals  PE Pleasant, mildly anxious F in NAD BP 122/80   Ht 5\' 7"  (1.702 m)   Wt 168 lb (76.2 kg)   BMI 26.31 kg/m   Skin: minimal changes only some mild stria / not hyperelastic [place] : 2 Elbows 0/0  5th fingers 1/0 Thumbs 0/0  Lumbar 1 knees 0/0? Shoulders: mild increase in mobility able to grasp hands at finger tips Hips - norm ROM SIJ: - normal mobility  Neck - spasm over trapezius MM bilaterally/ good posture and ROM

## 2018-10-14 NOTE — Assessment & Plan Note (Signed)
No significant pulse change sitting or standing when I evaluated her today Today's exam not consistent with POTS

## 2018-10-14 NOTE — Assessment & Plan Note (Signed)
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  EDS does not seem to explain the abnormal lab findings and the timing of her onset of sxs being related to living in [place]. No family Hx to  Make risk of EDS higher.  I advised her that I did not see using typical EDS treatment protocols.  Although she does have some systemic changes that could be consistent with EDS these could also be caused by other types of chronic disease.  I suggested following up with ID to try to explain abnormal labs and see if infectious issues could have triggered some of her complex sxs.

## 2018-12-12 ENCOUNTER — Encounter (HOSPITAL_COMMUNITY): Payer: Self-pay | Admitting: Emergency Medicine

## 2018-12-12 ENCOUNTER — Ambulatory Visit (HOSPITAL_COMMUNITY)
Admission: EM | Admit: 2018-12-12 | Discharge: 2018-12-12 | Disposition: A | Discharge status: Home / Self Care | Source: Home / Self Care

## 2018-12-12 ENCOUNTER — Other Ambulatory Visit: Payer: Self-pay

## 2018-12-12 ENCOUNTER — Emergency Department (HOSPITAL_COMMUNITY)

## 2018-12-12 ENCOUNTER — Emergency Department (HOSPITAL_COMMUNITY)
Admission: EM | Admit: 2018-12-12 | Discharge: 2018-12-12 | Disposition: A | Discharge status: Home / Self Care | Attending: Emergency Medicine | Admitting: Emergency Medicine

## 2018-12-12 DIAGNOSIS — R5383 Other fatigue: Secondary | ICD-10-CM | POA: Insufficient documentation

## 2018-12-12 DIAGNOSIS — Z79899 Other long term (current) drug therapy: Secondary | ICD-10-CM | POA: Insufficient documentation

## 2018-12-12 DIAGNOSIS — R079 Chest pain, unspecified: Secondary | ICD-10-CM | POA: Insufficient documentation

## 2018-12-12 HISTORY — DX: Other fatigue: R53.83

## 2018-12-12 HISTORY — DX: Unspecified mononeuropathy of unspecified lower limb: G57.90

## 2018-12-12 HISTORY — DX: Unspecified temporomandibular joint disorder, unspecified side: M26.609

## 2018-12-12 HISTORY — DX: Ehlers-Danlos syndrome, unspecified: Q79.60

## 2018-12-12 HISTORY — DX: Achalasia of cardia: K22.0

## 2018-12-12 HISTORY — DX: Unspecified injury of face, initial encounter: S09.93XA

## 2018-12-12 HISTORY — DX: Other malaise: R53.81

## 2018-12-12 LAB — URINALYSIS, ROUTINE W REFLEX MICROSCOPIC
BACTERIA UA: NONE SEEN
BILIRUBIN URINE: NEGATIVE
GLUCOSE, UA: NEGATIVE mg/dL
KETONES UR: NEGATIVE mg/dL
Leukocytes, UA: NEGATIVE
NITRITE: NEGATIVE
PH: 8 (ref 5.0–8.0)
Protein, ur: NEGATIVE mg/dL
Specific Gravity, Urine: 1.005 (ref 1.005–1.030)

## 2018-12-12 LAB — COMPREHENSIVE METABOLIC PANEL
ALT: 16 U/L (ref 0–44)
AST: 17 U/L (ref 15–41)
Albumin: 4.5 g/dL (ref 3.5–5.0)
Alkaline Phosphatase: 33 U/L — ABNORMAL LOW (ref 38–126)
Anion gap: 8 (ref 5–15)
BUN: 12 mg/dL (ref 6–20)
CALCIUM: 8.9 mg/dL (ref 8.9–10.3)
CHLORIDE: 102 mmol/L (ref 98–111)
CO2: 27 mmol/L (ref 22–32)
CREATININE: 0.7 mg/dL (ref 0.44–1.00)
GFR calc Af Amer: >60 mL/min (ref >60)
GFR calc non Af Amer: >60 mL/min (ref >60)
Glucose, Bld: 86 mg/dL (ref 70–99)
Potassium: 3.6 mmol/L (ref 3.5–5.1)
Sodium: 137 mmol/L (ref 135–145)
Total Bilirubin: 0.5 mg/dL (ref 0.3–1.2)
Total Protein: 7 g/dL (ref 6.5–8.1)

## 2018-12-12 LAB — CBC WITH DIFFERENTIAL/PLATELET
ABS IMMATURE GRANULOCYTES: 0.03 10*3/uL (ref 0.00–0.07)
BASOS PCT: 1 %
Basophils Absolute: 0.1 10*3/uL (ref 0.0–0.1)
EOS ABS: 0.2 10*3/uL (ref 0.0–0.5)
EOS PCT: 3 %
HCT: 36.3 % (ref 36.0–46.0)
Hemoglobin: 11.8 g/dL — ABNORMAL LOW (ref 12.0–15.0)
Immature Granulocytes: 0 %
Lymphocytes Relative: 19 %
Lymphs Abs: 1.6 10*3/uL (ref 0.7–4.0)
MCH: 29.5 pg (ref 26.0–34.0)
MCHC: 32.5 g/dL (ref 30.0–36.0)
MCV: 90.8 fL (ref 80.0–100.0)
MONOS PCT: 9 %
Monocytes Absolute: 0.8 10*3/uL (ref 0.1–1.0)
Neutro Abs: 5.7 10*3/uL (ref 1.7–7.7)
Neutrophils Relative %: 68 %
PLATELETS: 234 10*3/uL (ref 150–400)
RBC: 4 MIL/uL (ref 3.87–5.11)
RDW: 12 % (ref 11.5–15.5)
WBC: 8.4 10*3/uL (ref 4.0–10.5)
nRBC: 0 % (ref 0.0–0.2)

## 2018-12-12 LAB — I-STAT BETA HCG BLOOD, ED (MC, WL, AP ONLY): I-stat hCG, quantitative: <5 m[IU]/mL (ref <5)

## 2018-12-12 LAB — INFLUENZA PANEL BY PCR (TYPE A & B)
INFLAPCR: NEGATIVE
INFLBPCR: NEGATIVE

## 2018-12-12 LAB — I-STAT TROPONIN, ED: Troponin i, poc: 0 ng/mL (ref 0.00–0.08)

## 2018-12-12 LAB — GROUP A STREP BY PCR: Group A Strep by PCR: NOT DETECTED

## 2018-12-12 LAB — MONONUCLEOSIS SCREEN: Mono Screen: NEGATIVE

## 2018-12-12 MED ORDER — SODIUM CHLORIDE 0.9 % IV BOLUS
1000.0000 mL | Freq: Once | INTRAVENOUS | Status: AC
  Administered 2018-12-12: 1000 mL via INTRAVENOUS

## 2018-12-12 NOTE — ED Notes (Signed)
Patient and patient's spouse was given discharge teaching and verbalized understanding.

## 2018-12-12 NOTE — ED Notes (Signed)
ED Provider at bedside. 

## 2018-12-12 NOTE — Discharge Instructions (Addendum)
Your evaluated today for fatigue. Your blood work was negative.  Please help with your PCP for reevaluation.  Please make sure you have adequate oral intake.  Return to the ED for any worsening symptoms.

## 2018-12-12 NOTE — ED Provider Notes (Signed)
[facility]-EMERGENCY DEPT Provider Note   CSN: [id number] Arrival date & time: 12/12/18  1429   History   Chief Complaint Chief Complaint  Patient presents with  . Fatigue    HPI Kathryn Riggs is a 47 y.o. female with past medical history significant for chronic fatigue, lower extremity neuropathy, malaise, TMJ disorder, Ehlers-Danlos, degenerative disc disease in cervical region, chronic anemia, chronic dizziness who presents for evaluation of multiple symptoms.  Patient states she has had fatigue x2 years.  She states that she feels like she does not want to get out of bed in the mornings secondary to fatigue.  Patient states she also has a headache, neck pain, lower back pain, chest pain.  Patient states these are all chronic in nature, however her chest pain feels "different."  Patient states she does have a history of esophageal dysmotility.  Patient states she has seen her PCP for all these issues including her fatigue, however no one has been able to figure out "what is wrong with me."  Patient states multiple providers have referred her over to [facility] for evaluation of her multiple symptoms.  Patient sees neurology, orthopedics, neurosurgery, infectious disease, gastroenterology, pulmonology.  Denies fever, chills, abdominal pain, dysuria, decreased range of motion, slurred speech, unilateral weakness, lower extremity swelling, cough, hemoptysis, palpitations, exogenous hormone use, recent surgery, recent immobilization.  Patient states "I am not depressed."  Patient admits to headache located over the occipital region.  Describes pain as a tightening sensation.  Rates this a 3/10.  States this has been gradual in nature.  Denies sudden onset, thunderclap headache.   Patient states chest pain is a "nagging sensation".  Denies associated lightheaded, dizziness, diaphoresis, radiation of pain.  Pain is not exertional in nature.  Pain began 2 days ago.  Pain has  been intermittent in nature.  States pain is worse when she moves.  Rates her pain a 3/10.  She does have a history of chronic anemia.  States she did have a laboratory work-up on Monday with her PCP.  States nurse called her and told her "everything was normal."  Thorough review of patient's past medical history.  History obtained from patient as well as significant other.  No interpreter was used.  HPI  Past Medical History:  Diagnosis Date  . Anemia   . DDD (degenerative disc disease), cervical   . Ehlers-Danlos syndrome   . Facial trauma   . Fatigue   . Herniated disc, cervical   . 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 Lower extremity neuropathy   . Malaise   . [facility] spotted fever    treated for this in 2017   . Temporomandibular joint disorder     Patient Active Problem List   Diagnosis Date Noted  . Headache 10/29/2016  . Dizziness and giddiness 10/29/2016  . Insomnia 05/14/2016  . 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 Muscle pain 04/30/2016  . Achilles tendinosis 04/30/2016    Past Surgical History:  Procedure Laterality Date  . epiosotomy  2007     OB History   No obstetric history on file.      Home Medications    Prior to Admission medications   Medication Sig Start Date End Date Taking? Authorizing Provider  famotidine (PEPCID) 20 MG tablet Take 20 mg by mouth daily.   Yes [provider]  omeprazole (PRILOSEC) 20 MG capsule Take 20 mg by  mouth daily.  09/23/18  Yes [provider]    Family History Family History  Problem Relation Age of Onset  . Breast cancer Paternal Aunt   . Stroke Neg Hx   . Neuropathy Neg Hx   . Multiple sclerosis Neg Hx   . Migraines Neg Hx     Social History Social History   Tobacco Use  . Smoking status: Never Smoker  . Smokeless tobacco: Never Used  Substance Use Topics  . Alcohol use: No  . Drug use: No      Allergies   Ciprofloxacin and Adhesive [tape]   Review of Systems Review of Systems  HENT: Positive for sore throat. Negative for congestion, drooling, ear discharge, ear pain, facial swelling, hearing loss, mouth sores, nosebleeds, postnasal drip, rhinorrhea, sinus pressure, sinus pain, sneezing, tinnitus, trouble swallowing and voice change.   Eyes: Negative.   Respiratory: Negative.   Cardiovascular: Positive for chest pain. Negative for palpitations and leg swelling.  Gastrointestinal: Negative.   Genitourinary: Negative.   Musculoskeletal: Positive for back pain and neck pain. Negative for arthralgias, gait problem, joint swelling, myalgias and neck stiffness.  Skin: Negative.   All other systems reviewed and are negative.    Physical Exam Updated Vital Signs BP 117/85   Pulse 84   Temp 98.1 F (36.7 C) (Oral)   Resp (!) 21   Ht 5\' 7"  (1.702 m)   Wt 74.8 kg   LMP 12/11/2018   SpO2 97%   BMI 25.84 kg/m   Physical Exam  Physical Exam  Constitutional: Pt is oriented to person, place, and time. Pt appears well-developed and well-nourished. No distress.  HENT:  Head: Normocephalic and atraumatic.  Mouth/Throat: Oropharynx is clear and moist.  Eyes: Conjunctivae and EOM are normal. Pupils are equal, round, and reactive to light. No scleral icterus.  No horizontal, vertical or rotational nystagmus  Neck: Normal range of motion.  Full passive ROM without pain. Patient refuses neck examination.  States "do not touch my neck." Cardiovascular: Normal rate, regular rhythm and intact distal pulses.   Pulmonary/Chest: Effort normal and breath sounds normal. No respiratory distress. Pt has no wheezes. No rales.  Abdominal: Soft. Bowel sounds are normal. There is no tenderness. There is no rebound and no guarding.  Musculoskeletal: Normal range of motion.  Lymphadenopathy:    No cervical adenopathy.  Neurological: Pt. is alert and oriented to person, place, and time. He  has normal reflexes. No cranial nerve deficit.  Exhibits normal muscle tone. Coordination normal.  Mental Status:  Alert, oriented, thought content appropriate. Speech fluent without evidence of aphasia. Able to follow 2 step commands without difficulty.  Cranial Nerves:  II:  Peripheral visual fields grossly normal, pupils equal, round, reactive to light III,IV, VI: ptosis not present, extra-ocular motions intact bilaterally  V,VII: smile symmetric, facial light touch sensation equal VIII: hearing grossly normal bilaterally  IX,X: midline uvula rise  XI: bilateral shoulder shrug equal and strong XII: midline tongue extension  Motor:  5/5 in upper and lower extremities bilaterally including strong and equal grip strength and dorsiflexion/plantar flexion Sensory: Pinprick and light touch normal in all extremities.  Deep Tendon Reflexes: 2+ and symmetric  Cerebellar: normal finger-to-nose with bilateral upper extremities Gait: normal gait and balance CV: distal pulses palpable throughout   Skin: Skin is warm and dry. No rash noted. Pt is not diaphoretic.  Psychiatric: Pt has a normal mood and affect. Behavior is normal. Judgment and thought content normal.  Nursing note  and vitals reviewed. ED Treatments / Results  Labs (all labs ordered are listed, but only abnormal results are displayed) Labs Reviewed  CBC WITH DIFFERENTIAL/PLATELET - Abnormal; Notable for the following components:      Result Value   Hemoglobin 11.8 (*)    All other components within normal limits  COMPREHENSIVE METABOLIC PANEL - Abnormal; Notable for the following components:   Alkaline Phosphatase 33 (*)    All other components within normal limits  URINALYSIS, ROUTINE W REFLEX MICROSCOPIC - Abnormal; Notable for the following components:   Color, Urine STRAW (*)    Hgb urine dipstick MODERATE (*)    All other components within normal limits  GROUP A STREP BY PCR  URINE CULTURE  MONONUCLEOSIS SCREEN   INFLUENZA PANEL BY PCR (TYPE A & B)  I-STAT TROPONIN, ED  I-STAT BETA HCG BLOOD, ED ([facility], [facility], [facility] ONLY)    EKG EKG Interpretation  Date/Time:  Friday December 12 2018 18:25:38 EST Ventricular Rate:  62 PR Interval:    QRS Duration: 85 QT Interval:  439 QTC Calculation: 446 R Axis:   73 Text Interpretation:  Sinus rhythm When compared with ECG of 02/27/2018 No significant change was found Confirmed by Samuel JesterMcManus, Kathleen [phone]([id number]) on 12/12/2018 6:35:19 PM   Radiology No results found.  Procedures Procedures (including critical care time)  Medications Ordered in ED Medications  sodium chloride 0.9 % bolus 1,000 mL (0 mLs Intravenous Stopped 12/12/18 1755)     Initial Impression / Assessment and Plan / ED Course  I have reviewed the triage vital signs and the nursing notes.  Pertinent labs & imaging results that were available during my care of the patient were reviewed by me and considered in my medical decision making (see chart for details).  47 year old female who appears otherwise well presents for evaluation of multiple complaints.  With complaints of headache, neck pain, chest pain, lower back pain.  Patient has been seen by PCP as well as multiple specialists for this issue.  Patient states she has had extreme fatigue.  Had negative blood work 5 days ago at her PCP office.  Normal limited musculoskeletal exam as well as limited neurologic exam.  Patient will not let me palpate her neck or her lumbar spine.  Patient refusing imaging of head, spine, chest xray.  She moves all extremities without difficulty.  I have thoroughly reviewed patient's past medical records.  She is being followed by her PCP, infectious disease, orthopedics, neurosurgery, pulmonology, cardiology, rheumatology for similar symptoms.  It seems looking at these notes they have referred [facility] of her symptoms.  [facility] similar complaints on  Sunday with negative evaluation.  Will obtain lab work, imaging, EKG, urinalysis and reevaluate.  hCG negative, urinalysis without evidence of infection, group A strep negative, troponin negative, influenza negative, mono negative, metabolic panel without any evidence of electrolyte, renal or liver abnormalities.  Refused chest x-ray.  Heart score 1, low risk.  PERC/Wells criteria negative.  Low suspicion for ACS, PE, dissection causing patient's chest pain.  Musculoskeletal and neurologic exam limited secondary to patient refusal.  Low suspicion for acute emergent pathology causing patient's symptoms at this time.  Patient's fatigue likely related to her chronic fatigue.  Denies upper respiratory symptoms.  She does not meet Sirs or sepsis criteria.  She may have a possible viral component as to why she has been experiencing increased fatigue over the last few days.  Patient has had sleep study done, and was prescribed CPAP.  Patient does not use this.  This may also be contributing to her chronic fatigue.  Low suspicion for CVA causing patient's symptoms.  Abdomen, nontender without rebound, guarding or rigidity.  Moves all extremities without difficulty.  Patient is adamant that she is not depressed.  Negative Romberg, pronator drift.  Cranial nerves II through XII grossly intact.  Negative finger-to-nose.  No lower extremity swelling.  Patient had normal TSH, T3, T4 4 days ago at her PCP.  Do not feel we need to reorder this testing.  Patient hemodynamically stable and appropriate for DC home at this time.  She did receive 1 L of fluids in department.  She does not have an anemia that would require transfusion at this time.  Orthostatic vital signs negative.  EKG normal sinus rhythm, no evidence of ischemic changes.  Discussed follow-up with PCP as well as her multiple outpatient specialist for reevaluation of her fatigue as well as her chronic pain.  Discussed strict return precautions.  Patient voiced  understanding and is agreeable for follow-up.     Final Clinical Impressions(s) / ED Diagnoses   Final diagnoses:  Fatigue, unspecified type    ED Discharge Orders    None       Tremeka Helbling A, PA-C 12/12/18 1858    Samuel Jester, DO 12/15/18 [phone]

## 2018-12-12 NOTE — ED Triage Notes (Signed)
Patient c/o fatigue. Patient stated that they have been feeling like not getting up from bed because they are so tired. Pt woke up Monday with right eye aching. Pt has had lower back pain for several weeks.   Today they patient states her head is hurting. Pt states they feel achy and hot.   Hx of low iron.

## 2018-12-12 NOTE — ED Notes (Signed)
Patient ambulated out of ED w/ spouse. Patient is being driven home by spouse.

## 2018-12-13 LAB — URINE CULTURE: Culture: NO GROWTH

## 2018-12-26 ENCOUNTER — Emergency Department (HOSPITAL_COMMUNITY)

## 2018-12-26 ENCOUNTER — Other Ambulatory Visit: Payer: Self-pay

## 2018-12-26 ENCOUNTER — Emergency Department (HOSPITAL_COMMUNITY)
Admission: EM | Admit: 2018-12-26 | Discharge: 2018-12-26 | Disposition: A | Discharge status: Home / Self Care | Attending: Emergency Medicine | Admitting: Emergency Medicine

## 2018-12-26 ENCOUNTER — Emergency Department (HOSPITAL_COMMUNITY)
Admission: EM | Admit: 2018-12-26 | Discharge: 2018-12-27 | Discharge status: Against Medical Advice | Source: Home / Self Care

## 2018-12-26 ENCOUNTER — Encounter (HOSPITAL_COMMUNITY): Payer: Self-pay | Admitting: *Deleted

## 2018-12-26 ENCOUNTER — Encounter (HOSPITAL_COMMUNITY): Payer: Self-pay | Admitting: Emergency Medicine

## 2018-12-26 DIAGNOSIS — J181 Lobar pneumonia, unspecified organism: Secondary | ICD-10-CM | POA: Diagnosis not present

## 2018-12-26 DIAGNOSIS — J189 Pneumonia, unspecified organism: Secondary | ICD-10-CM

## 2018-12-26 DIAGNOSIS — F419 Anxiety disorder, unspecified: Secondary | ICD-10-CM | POA: Diagnosis not present

## 2018-12-26 DIAGNOSIS — Z79899 Other long term (current) drug therapy: Secondary | ICD-10-CM | POA: Insufficient documentation

## 2018-12-26 DIAGNOSIS — R05 Cough: Secondary | ICD-10-CM | POA: Diagnosis present

## 2018-12-26 LAB — I-STAT BETA HCG BLOOD, ED (MC, WL, AP ONLY): I-stat hCG, quantitative: <5 m[IU]/mL (ref <5)

## 2018-12-26 LAB — CBC
HCT: 36.3 % (ref 36.0–46.0)
HCT: 35.4 % — ABNORMAL LOW (ref 36.0–46.0)
Hemoglobin: 12 g/dL (ref 12.0–15.0)
Hemoglobin: 11.4 g/dL — ABNORMAL LOW (ref 12.0–15.0)
MCH: 29.2 pg (ref 26.0–34.0)
MCH: 27.9 pg (ref 26.0–34.0)
MCHC: 33.1 g/dL (ref 30.0–36.0)
MCHC: 32.2 g/dL (ref 30.0–36.0)
MCV: 88.3 fL (ref 80.0–100.0)
MCV: 86.8 fL (ref 80.0–100.0)
Platelets: 230 10*3/uL (ref 150–400)
Platelets: 233 10*3/uL (ref 150–400)
RBC: 4.11 MIL/uL (ref 3.87–5.11)
RBC: 4.08 MIL/uL (ref 3.87–5.11)
RDW: 11.9 % (ref 11.5–15.5)
RDW: 11.8 % (ref 11.5–15.5)
WBC: 4.9 10*3/uL (ref 4.0–10.5)
WBC: 4.7 10*3/uL (ref 4.0–10.5)
nRBC: 0 % (ref 0.0–0.2)
nRBC: 0 % (ref 0.0–0.2)

## 2018-12-26 LAB — BASIC METABOLIC PANEL
ANION GAP: 9 (ref 5–15)
Anion gap: 10 (ref 5–15)
BUN: 5 mg/dL — AB (ref 6–20)
BUN: 6 mg/dL (ref 6–20)
CHLORIDE: 100 mmol/L (ref 98–111)
CO2: 24 mmol/L (ref 22–32)
CO2: 23 mmol/L (ref 22–32)
Calcium: 8.9 mg/dL (ref 8.9–10.3)
Calcium: 8.7 mg/dL — ABNORMAL LOW (ref 8.9–10.3)
Chloride: 98 mmol/L (ref 98–111)
Creatinine, Ser: 0.67 mg/dL (ref 0.44–1.00)
Creatinine, Ser: 0.71 mg/dL (ref 0.44–1.00)
GFR calc Af Amer: >60 mL/min (ref >60)
GFR calc Af Amer: >60 mL/min (ref >60)
GFR calc non Af Amer: >60 mL/min (ref >60)
GFR calc non Af Amer: >60 mL/min (ref >60)
Glucose, Bld: 96 mg/dL (ref 70–99)
Glucose, Bld: 96 mg/dL (ref 70–99)
POTASSIUM: 3 mmol/L — AB (ref 3.5–5.1)
Potassium: 3.1 mmol/L — ABNORMAL LOW (ref 3.5–5.1)
SODIUM: 133 mmol/L — AB (ref 135–145)
Sodium: 131 mmol/L — ABNORMAL LOW (ref 135–145)

## 2018-12-26 LAB — I-STAT TROPONIN, ED
Troponin i, poc: 0 ng/mL (ref 0.00–0.08)
Troponin i, poc: 0 ng/mL (ref 0.00–0.08)

## 2018-12-26 MED ORDER — AZITHROMYCIN 200 MG/5ML PO SUSR: ORAL | 0 refills | Status: DC

## 2018-12-26 NOTE — ED Triage Notes (Signed)
Pt stated "This is the 3rd time I've been to the ER.  We went to [facility] last Friday, when I got there I coughed up some yellow and felt right side c/p.  Seen in the ER there, was offered CT of the chest, they gave me benadryl but it didn't do well for me so I didn't get the CT.  Was given azithromycin but didn't take it because we were traveling back."

## 2018-12-26 NOTE — ED Triage Notes (Signed)
Pt states this is her 4th ED visit this week.  Reports diagnosed with pneumonia.  Not taking antibiotics because she talked with her PCP and decided it was probably a viral infection.  States she was seen at [facility] ED last night.  Reports headache, bilateral arms aching, fatigue, bilateral leg weakness, SOB when talking, and intermittent L sided chest pain since 4:30pm.

## 2018-12-26 NOTE — ED Notes (Signed)
ED Provider at bedside. 

## 2018-12-26 NOTE — Discharge Instructions (Addendum)
Contact a health care provider if: °You have a fever. °You are losing sleep because you cannot control your cough with cough medicine. °Get help right away if: °You have worsening shortness of breath. °You have increased chest pain. °Your sickness becomes worse, especially if you are an older adult or have a weakened immune system. °You cough up blood. °

## 2018-12-26 NOTE — ED Provider Notes (Signed)
[facility]-EMERGENCY DEPT Provider Note   CSN: [id number] Arrival date & time: 12/26/18  0133     History   Chief Complaint Chief Complaint  Patient presents with  . Chest Pain    HPI Kathryn Riggs is a 48 y.o. female.  Who presents the emergency department chief complaint of cough.  Patient was seen twice at [facility] in [place] at their emergency department.  She had a chest x-ray that showed a middle lobe infiltrate twice.  She had a negative d-dimer.  She was offered CT of the chest but was afraid of getting contrast dye.  Patient was also given a azithromycin but was afraid to take the azithromycin because she is afraid she might get C. difficile colitis.  Is a history of Ehlers-Danlos syndrome and esophageal dysmotility.  She denies fever, chills, shortness of breath.  She does complain of chest congestion and painful cough.  HPI  Past Medical History:  Diagnosis Date  . Anemia   . DDD (degenerative disc disease), cervical   . Ehlers-Danlos syndrome   . Facial trauma   . Fatigue   . Herniated disc, cervical   . 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 Lower extremity neuropathy   . Malaise   . [facility] spotted fever    treated for this in 2017   . Temporomandibular joint disorder     Patient Active Problem List   Diagnosis Date Noted  . Headache 10/29/2016  . Dizziness and giddiness 10/29/2016  . Insomnia 05/14/2016  . 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 Muscle pain 04/30/2016  . Achilles tendinosis 04/30/2016    Past Surgical History:  Procedure Laterality Date  . epiosotomy  2007     OB History   No obstetric history on file.      Home Medications    Prior to Admission medications   Medication Sig Start Date End Date Taking? Authorizing Provider  famotidine (PEPCID) 20 MG tablet Take 20 mg by mouth daily.    [provider]  omeprazole  (PRILOSEC) 20 MG capsule Take 20 mg by mouth daily.  09/23/18   [provider]    Family History Family History  Problem Relation Age of Onset  . Breast cancer Paternal Aunt   . Stroke Neg Hx   . Neuropathy Neg Hx   . Multiple sclerosis Neg Hx   . Migraines Neg Hx     Social History Social History   Tobacco Use  . Smoking status: Never Smoker  . Smokeless tobacco: Never Used  Substance Use Topics  . Alcohol use: No  . Drug use: No     Allergies   Ciprofloxacin and Adhesive [tape]   Review of Systems Review of Systems  Ten systems reviewed and are negative for acute change, except as noted in the HPI.   Physical Exam Updated Vital Signs BP 130/80   Pulse 78   Temp 98.5 F (36.9 C) (Oral)   Resp 19   Ht 5\' 7"  (1.702 m)   Wt 77.1 kg   LMP 12/11/2018 (Approximate)   SpO2 100%   BMI 26.63 kg/m   Physical Exam Vitals signs and nursing note reviewed.  Constitutional:      General: She is not in acute distress.    Appearance: She is well-developed. She is not diaphoretic.  HENT:     Head: Normocephalic and atraumatic.  Eyes:     General:  No scleral icterus.    Conjunctiva/sclera: Conjunctivae normal.  Neck:     Musculoskeletal: Normal range of motion.  Cardiovascular:     Rate and Rhythm: Normal rate and regular rhythm.     Heart sounds: Normal heart sounds. No murmur. No friction rub. No gallop.   Pulmonary:     Effort: Pulmonary effort is normal. No respiratory distress.     Breath sounds: Normal breath sounds. No wheezing, rhonchi or rales.  Abdominal:     General: Bowel sounds are normal. There is no distension.     Palpations: Abdomen is soft. There is no mass.     Tenderness: There is no abdominal tenderness. There is no guarding.  Skin:    General: Skin is warm and dry.  Neurological:     Mental Status: She is alert and oriented to person, place, and time.  Psychiatric:        Mood and Affect: Mood is anxious.        Behavior:  Behavior normal.      ED Treatments / Results  Labs (all labs ordered are listed, but only abnormal results are displayed) Labs Reviewed  BASIC METABOLIC PANEL - Abnormal; Notable for the following components:      Result Value   Sodium 131 (*)    Potassium 3.0 (*)    BUN 5 (*)    All other components within normal limits  CBC  I-STAT TROPONIN, ED  I-STAT BETA HCG BLOOD, ED ([facility], [facility], [facility] ONLY)    EKG None  Radiology No results found.  Procedures Procedures (including critical care time)  Medications Ordered in ED Medications - No data to display   Initial Impression / Assessment and Plan / ED Course  I have reviewed the triage vital signs and the nursing notes.  Pertinent labs & imaging results that were available during my care of the patient were reviewed by me and considered in my medical decision making (see chart for details).     48 year old female who presents with symptoms of URI.  I was able to review the patient's imaging and chart at [facility] emergency department.  Chest x-ray does show a right middle lobe infiltrate.  There is a questionable cyst in the epicardium read by 1 of the radiologist however there is no recommendation for further imaging or outpatient work-up.  I discussed that with the patient and if she feels she has more questions and needs more work-up she may see cardiology.  I do not feel that she needs further imaging of her chest at this time.  We discussed the risks and benefits and given amount of radiation and diagnosis of community-acquired pneumonia feel that she needs any further work-up.  The patient had a negative d-dimer.  I had a lengthy discussion with the patient about antibiotic use.  She has difficulty swallowing and will be given liquid azithromycin.  I also discussed the fact that there frequently bacterial community-acquired pneumonias that are resistant in this community to a azithromycin alone and she  would get broader recover coverage with Rocephin however she refuses at this time.  Patient unable to tolerate doxycycline or Levaquin.  All questions answered to the best of my ability.  I discussed return precautions.  She was appropriate for discharge at this time.  Final Clinical Impressions(s) / ED Diagnoses   Final diagnoses:  None    ED Discharge Orders    None       Arthor Captain,  PA-C 12/26/18 [id number]    Shaune PollackIsaacs, Cameron, MD 12/26/18 1904

## 2018-12-27 NOTE — ED Notes (Signed)
Pt. told registration she was leaving and going to [facility].

## 2019-01-08 ENCOUNTER — Other Ambulatory Visit: Payer: Self-pay | Admitting: Physician Assistant

## 2019-01-08 DIAGNOSIS — N6001 Solitary cyst of right breast: Secondary | ICD-10-CM

## 2019-01-08 DIAGNOSIS — Z1231 Encounter for screening mammogram for malignant neoplasm of breast: Secondary | ICD-10-CM

## 2019-01-09 ENCOUNTER — Encounter

## 2019-01-22 ENCOUNTER — Encounter

## 2019-01-22 ENCOUNTER — Other Ambulatory Visit

## 2019-06-12 ENCOUNTER — Other Ambulatory Visit

## 2019-06-12 ENCOUNTER — Encounter

## 2019-08-17 ENCOUNTER — Encounter

## 2019-08-17 ENCOUNTER — Other Ambulatory Visit

## 2019-08-25 ENCOUNTER — Inpatient Hospital Stay: Admission: RE | Admit: 2019-08-25 | Source: Ambulatory Visit

## 2019-08-25 ENCOUNTER — Other Ambulatory Visit

## 2019-09-25 ENCOUNTER — Ambulatory Visit: Attending: Sports Medicine | Admitting: Physical Therapy

## 2019-09-25 ENCOUNTER — Other Ambulatory Visit: Payer: Self-pay

## 2019-09-25 ENCOUNTER — Encounter: Payer: Self-pay | Admitting: Physical Therapy

## 2019-09-25 DIAGNOSIS — M25561 Pain in right knee: Secondary | ICD-10-CM | POA: Insufficient documentation

## 2019-09-25 DIAGNOSIS — M6281 Muscle weakness (generalized): Secondary | ICD-10-CM | POA: Diagnosis present

## 2019-09-25 DIAGNOSIS — M357 Hypermobility syndrome: Secondary | ICD-10-CM | POA: Diagnosis present

## 2019-09-25 DIAGNOSIS — M545 Low back pain, unspecified: Secondary | ICD-10-CM

## 2019-09-25 DIAGNOSIS — R29898 Other symptoms and signs involving the musculoskeletal system: Secondary | ICD-10-CM | POA: Diagnosis present

## 2019-09-25 DIAGNOSIS — G8929 Other chronic pain: Secondary | ICD-10-CM | POA: Diagnosis present

## 2019-09-25 NOTE — Therapy (Addendum)
[place], [place], [address] Phone: [phone]   Fax:  [phone]  Physical Therapy Evaluation/Discharge  Patient Details  Name: Kathryn Riggs MRN: [id number] Date of Birth: [birth year] Referring Provider (PT): Dr. Vickki Hearing    Encounter Date: 09/25/2019  PT End of Session - 09/25/19 0957    Visit Number  1    Number of Visits  12   8-12 visits allow 6 weeks for scheduling   Date for PT Re-Evaluation  11/06/19    PT Start Time  0750    PT Stop Time  0838    PT Time Calculation (min)  48 min    Activity Tolerance  Patient tolerated treatment well    Behavior During Therapy  [facility] for tasks assessed/performed;Anxious       Past Medical History:  Diagnosis Date  . Anemia   . DDD (degenerative disc disease), cervical   . Ehlers-Danlos syndrome   . Facial trauma   . Fatigue   . Herniated disc, cervical   . 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 Lower extremity neuropathy   . Malaise   . [facility] spotted fever    treated for this in 2017   . Temporomandibular joint disorder     Past Surgical History:  Procedure Laterality Date  . epiosotomy  2007    There were no vitals filed for this visit.   Subjective Assessment - 09/25/19 0755    Subjective  Patient with joint hypermobility issues which to her, seem to be more of a sudden onset. Has been doing PT for several months in [place] with PT specializing in connective tissue disorders.      In 06/2012 she walked and stretched Rt calf and had sudden nerve pain in calf, that seemed to have been the trigger.   Eventually couldnt put weight on it.  Has difficulty walking > mile, lifting and performing ADLs.    Pertinent History  Rickettsia (antibody to "something") may be a cross reaction /[place] spotted Fever,  +Lyme. Mast Cell activation.esphogeal weakness, low Iron, brain fog, cervical instability, dizziness,  headache, nausea (Chairi Malformation vs Formen Magnum stenosis?    Limitations  Standing;Walking;House hold activities;Other (comment);Lifting   exercise   How long can you sit comfortably?  would like to improve posture    How long can you walk comfortably?  can do a mile but often, Rt knee/leg pain    Diagnostic tests  CT spine 10/06/18, see media    Patient Stated Goals  would like to be able to exercise regularly, spin, jog    Currently in Pain?  Yes    Pain Score  4     Pain Location  Knee    Pain Orientation  Right;Lateral;Anterior    Pain Descriptors / Indicators  Aching;Sore    Pain Type  Chronic pain    Pain Radiating Towards  Rt hip, lateral thigh, post knee    Pain Onset  More than a month ago    Pain Frequency  Intermittent    Aggravating Factors   walking, stretching? overactivity    Pain Relieving Factors  unsure    Effect of Pain on Daily Activities  limits her activity, fearful of reinjuring    Multiple Pain Sites  No         [facility] PT Assessment - 09/25/19 0001      Assessment   Medical Diagnosis  hypermobility  Referring Provider (PT)  Dr. Vickki Hearing     Onset Date/Surgical Date  --   2013 July    Prior Therapy  Yes       Precautions   Precautions  None      Restrictions   Weight Bearing Restrictions  No      Balance Screen   Has the patient fallen in the past 6 months  No   not confident      [place] residence    Additional Comments  [place]oved from [place]       Prior Function   Level of [place] Requirements  used to be a IT trainer, very active     Leisure  exercise, kids      Cognition   Overall Cognitive Status  Within Functional Limits for tasks assessed      Sensation   Light Touch  Appears Intact      Functional Tests   Functional tests  Squat;Step up;Step down;Single leg stance      Squat   Comments  pain in back afterwards, knee crepitus, 1/4 ROM        Step Up   Comments  no difficulty small step       Step Down   Comments  min difficulty small step       Single Leg Stance   Comments  challenging , < 15 sec and "wobbly"       Posture/Postural Control   Posture/Postural Control  Postural limitations    Postural Limitations  Rounded Shoulders;Forward head    Posture Comments  genu valgus, L shoulder and L hip higher       AROM   Overall AROM Comments  fearful of movement in trunk ROM     Right Knee Extension  -4    Right Knee Flexion  127    Left Knee Extension  0    Left Knee Flexion  128    Lumbar Flexion  50%    Lumbar Extension  50%    Lumbar - Right Side Bend  25%    Lumbar - Left Side Bend  25%    Lumbar - Right Rotation  50%    Lumbar - Left Rotation  50%      PROM   Overall PROM   Other (comment)    Overall PROM Comments  avoids hyperextension of knees    PROM hips limited in IR and flexion due to pain in groin, R      Strength   Right/Left Hip  --   does not want to extend knee for hip testing due to flare up   Right Hip Flexion  4/5    Right Hip ABduction  4+/5    Left Hip Flexion  5/5    Left Hip ABduction  4+/5    Right Knee Flexion  5/5    Right Knee Extension  4+/5    Left Knee Flexion  5/5    Left Knee Extension  4+/5      Flexibility   Hamstrings  WFL, fearful, 70 deg approx    Quadriceps  tight Rt due to apprehension     ITB  NT       Palpation   Patella mobility  normal     Spinal mobility  WNL throughout T-L spine     SI assessment   min pain but peripherally    Palpation  comment  L ASIS anteriorly rotated ?           Objective measurements completed on examination: See above findings.         PT Long Term Goals - 09/25/19 1055      PT LONG TERM GOAL #1   Title  Pt will be I with HEP for posture, core strength.    Time  6    Period  Weeks    Status  New    Target Date  11/06/19      PT LONG TERM GOAL #2   Title  Pt will be able to show proper posture and body mechanics  for lifting , ADLs    Time  6    Period  Weeks    Status  New    Target Date  11/05/19      PT LONG TERM GOAL #3   Title  Pt will be able to walk 2 miles for fitness with min increase in pain from baseline    Time  6    Period  Weeks    Status  New    Target Date  11/05/19      PT LONG TERM GOAL #4   Title  Pt will show good core strength and be able to maintain a neutral spine with HEP    Time  6    Period  Weeks    Status  New    Target Date  11/05/19      PT LONG TERM GOAL #5   Title  Pt will be able to perform SLR in all planes without increased back pain to demo improved stability in spine    Time  6    Period  Weeks    Status  New    Target Date  11/05/19             Plan - 09/25/19 1026    Clinical Impression Statement  Patient presents for high complexity eval for general joint hypermobility with multisystem involvement.  Due to fear of flare up, reinjury I was unable to see her joint hypermobility.  She has learned to protect her joints by not fully extending throughout the range of motion. I did not assess her cervical spine. Her hips and knees are not hypermobile that I can tell.  She does seem to have SIJ involvement but was limited in what she would so in order to preserve her comfort.  She is interested in trying Pilates as a long term solution to her mobility and fitness. She has had multiple episodes of PT without lasting success but in the past manual therapy has been beneficial.  We will focus on exercise and activity to work towards maximizing function.    Personal Factors and Comorbidities  Comorbidity 1;Comorbidity 3+;Past/Current Experience;Time since onset of injury/illness/exacerbation    Examination-Activity Limitations  Carry;Sleep;Stand;Stairs;Lift;Locomotion Level;Squat    Examination-Participation Restrictions  Community Activity;Interpersonal Relationship    Stability/Clinical Decision Making  Unstable/Unpredictable    Clinical Decision Making   High    Rehab Potential  Good    PT Frequency  2x / week    PT Duration  4 weeks   allow 6 weeks for scheduling may extend if progressing   PT Treatment/Interventions  ADLs/Self Care Home Management;Electrical Stimulation;Therapeutic activities;Patient/family education;Taping;Therapeutic exercise;Moist Heat;Ultrasound;Cryotherapy;Functional mobility training;Manual techniques;Dry needling;Passive range of motion;Neuromuscular re-education;Balance training;Other (comment)    PT Next Visit Plan  Intro to Stabilization for core, lumbopelvic, consider tape to SIJ (further  assessment), eventual basic Reformer ex, stretch knee and hip as able/willing    PT Home Exercise Plan  per pt she is only doing pelvic tilt/bracing and mini bridge, rolls her hip/thigh and does a calf stretch at home.  Avoids stretching other than calf.    Consulted and Agree with Plan of Care  Patient       Patient will benefit from skilled therapeutic intervention in order to improve the following deficits and impairments:  Dizziness, Increased fascial restricitons, Pain, Postural dysfunction, Hypermobility, Decreased mobility, Decreased activity tolerance, Decreased range of motion, Decreased strength, Impaired flexibility, Difficulty walking, Decreased balance  Visit Diagnosis: Hypermobility syndrome  Muscle weakness (generalized)  Chronic bilateral low back pain, unspecified whether sciatica present  Chronic pain of right knee  Other symptoms and signs involving the musculoskeletal system     Problem List Patient Active Problem List   Diagnosis Date Noted  . Headache 10/29/2016  . Dizziness and giddiness 10/29/2016  . Insomnia 05/14/2016  . 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 Muscle pain 04/30/2016  . Achilles tendinosis 04/30/2016    Kathryn Riggs 09/25/2019, 12:05 PM  [facility] [address] [place], [place],  [address] Phone: [phone]   Fax:  [phone]  Name: Kathryn Riggs MRN: [id number] Date of Birth: March 13, 1971   Raeford Razor, PT 09/25/19 12:05 PM Phone: [phone] Fax: [phone]  PHYSICAL THERAPY DISCHARGE SUMMARY  Visits from Start of Care: 1  Current functional level related to goals / functional outcomes: See eval    Remaining deficits: See eval    Education / Equipment: Initial eval  Plan: Patient agrees to discharge.  Patient goals were not met. Patient is being discharged due to a change in medical status.  ?????   Went to another facility.  Raeford Razor, PT 10/21/19 2:33 PM Phone: [phone] Fax: [phone]

## 2019-10-02 ENCOUNTER — Ambulatory Visit: Admitting: Physical Therapy

## 2019-10-07 ENCOUNTER — Ambulatory Visit: Admitting: Physical Therapy

## 2019-10-09 ENCOUNTER — Ambulatory Visit: Admitting: Physical Therapy

## 2019-10-13 ENCOUNTER — Ambulatory Visit: Admitting: Physical Therapy

## 2019-10-14 ENCOUNTER — Ambulatory Visit: Admitting: Physical Therapy

## 2019-10-21 ENCOUNTER — Encounter: Admitting: Physical Therapy

## 2019-10-23 ENCOUNTER — Encounter: Admitting: Physical Therapy

## 2019-10-26 ENCOUNTER — Encounter: Admitting: Physical Therapy

## 2019-10-28 ENCOUNTER — Encounter: Admitting: Physical Therapy

## 2019-12-07 ENCOUNTER — Other Ambulatory Visit

## 2019-12-07 ENCOUNTER — Encounter

## 2020-01-13 ENCOUNTER — Encounter

## 2020-01-13 ENCOUNTER — Other Ambulatory Visit

## 2020-01-15 ENCOUNTER — Ambulatory Visit: Admitting: Orthotics

## 2020-01-15 ENCOUNTER — Ambulatory Visit (INDEPENDENT_AMBULATORY_CARE_PROVIDER_SITE_OTHER): Admitting: Podiatry

## 2020-01-15 ENCOUNTER — Other Ambulatory Visit: Payer: Self-pay

## 2020-01-15 ENCOUNTER — Encounter: Payer: Self-pay | Admitting: Podiatry

## 2020-01-15 DIAGNOSIS — M779 Enthesopathy, unspecified: Secondary | ICD-10-CM | POA: Diagnosis not present

## 2020-01-15 DIAGNOSIS — M76829 Posterior tibial tendinitis, unspecified leg: Secondary | ICD-10-CM

## 2020-01-15 DIAGNOSIS — M6788 Other specified disorders of synovium and tendon, other site: Secondary | ICD-10-CM

## 2020-01-15 NOTE — Progress Notes (Signed)
Gave patient wedges for her powersteps.

## 2020-01-22 NOTE — Progress Notes (Signed)
Subjective:   Patient ID: Kathryn Riggs, female   DOB: 49 y.o.   MRN: [id number]   HPI 49 year old female presents the office today requesting orthotic adjustments for her power steps.  She states that she recently moved here from [place] and her physical therapist she is to make modifications to help with her posterior tibial tendinitis in her foot pain.  Her therapist currently is not able to do this and she presents today for evaluation.  She does not have any significant discomfort to her foot.  She states that as long as she is doing physical therapy this is been helpful.   Review of Systems  All other systems reviewed and are negative.  Past Medical History:  Diagnosis Date  . Anemia   . DDD (degenerative disc disease), cervical   . Ehlers-Danlos syndrome   . Facial trauma   . Fatigue   . Herniated disc, cervical   . 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 Lower extremity neuropathy   . Malaise   . [facility] spotted fever    treated for this in 2017   . Temporomandibular joint disorder     Past Surgical History:  Procedure Laterality Date  . epiosotomy  2007     Current Outpatient Medications:  .  famotidine (PEPCID) 20 MG tablet, Take 20 mg by mouth daily., Disp: , Rfl:   Allergies  Allergen Reactions  . Ciprofloxacin Other (See Comments)    tendonitis  . Adhesive [Tape] Other (See Comments)    scar         Objective:  Physical Exam  General: AAO x3, NAD  Dermatological: Skin is warm, dry and supple bilateral. Nails x 10 are well manicured; remaining integument appears unremarkable at this time. There are no open sores, no preulcerative lesions, no rash or signs of infection present.  Vascular: Dorsalis Pedis artery and Posterior Tibial artery pedal pulses are 2/4 bilateral with immedate capillary fill time. Pedal hair growth present. No varicosities and no lower extremity edema present bilateral. There is no pain with calf  compression, swelling, warmth, erythema.   Neruologic: Grossly intact via light touch bilateral.   Musculoskeletal: Mild discomfort on the course the posterior tibial tendon.  He is able to do a single and double heel rise.  There is no area pinpoint tenderness identified.  There is no edema, erythema. Muscular strength 5/5 in all groups tested bilateral.  Gait: Unassisted, Nonantalgic.       Assessment:    Posterior tibial tendinitis    Plan:  -Treatment options discussed including all alternatives, risks, and complications -Etiology of symptoms were discussed -She presents today to have modifications to her power steps.  I had Raiford Noble evaluate her today in order to make the appropriate adjustments.  Discussed continued physical therapy and she modifications.  I will see her back as needed.  She agrees this has no further questions.  Vivi Barrack DPM

## 2020-01-25 ENCOUNTER — Encounter

## 2020-01-25 ENCOUNTER — Other Ambulatory Visit

## 2020-02-01 ENCOUNTER — Encounter

## 2020-02-01 ENCOUNTER — Other Ambulatory Visit

## 2020-04-11 ENCOUNTER — Other Ambulatory Visit: Payer: Self-pay | Admitting: Physician Assistant

## 2020-04-11 DIAGNOSIS — N6001 Solitary cyst of right breast: Secondary | ICD-10-CM

## 2020-04-26 ENCOUNTER — Other Ambulatory Visit

## 2020-04-26 ENCOUNTER — Encounter

## 2020-10-11 ENCOUNTER — Ambulatory Visit: Admitting: Allergy

## 2020-10-25 ENCOUNTER — Ambulatory Visit: Admitting: Allergy

## 2020-11-14 ENCOUNTER — Other Ambulatory Visit: Payer: Self-pay | Admitting: Neurosurgery

## 2020-11-14 DIAGNOSIS — M542 Cervicalgia: Secondary | ICD-10-CM

## 2020-12-09 ENCOUNTER — Other Ambulatory Visit

## 2020-12-29 ENCOUNTER — Other Ambulatory Visit

## 2021-01-02 ENCOUNTER — Other Ambulatory Visit

## 2021-01-16 ENCOUNTER — Inpatient Hospital Stay: Admission: RE | Admit: 2021-01-16 | Source: Ambulatory Visit

## 2021-01-26 ENCOUNTER — Inpatient Hospital Stay: Admission: RE | Admit: 2021-01-26 | Source: Ambulatory Visit

## 2021-01-30 ENCOUNTER — Other Ambulatory Visit

## 2021-08-07 ENCOUNTER — Telehealth: Admitting: Podiatry

## 2021-08-07 NOTE — Telephone Encounter (Signed)
Pt left message Friday afternoon asking for a call back because she had a question.  I returned call and left message for pt to call me back.

## 2021-11-08 ENCOUNTER — Other Ambulatory Visit: Payer: Self-pay

## 2021-11-08 ENCOUNTER — Ambulatory Visit (HOSPITAL_BASED_OUTPATIENT_CLINIC_OR_DEPARTMENT_OTHER): Attending: Student | Admitting: Physical Therapy

## 2021-11-08 ENCOUNTER — Encounter (HOSPITAL_BASED_OUTPATIENT_CLINIC_OR_DEPARTMENT_OTHER): Payer: Self-pay | Admitting: Physical Therapy

## 2021-11-08 DIAGNOSIS — M6281 Muscle weakness (generalized): Secondary | ICD-10-CM | POA: Diagnosis present

## 2021-11-08 DIAGNOSIS — M25572 Pain in left ankle and joints of left foot: Secondary | ICD-10-CM | POA: Diagnosis not present

## 2021-11-08 DIAGNOSIS — R262 Difficulty in walking, not elsewhere classified: Secondary | ICD-10-CM | POA: Diagnosis present

## 2021-11-08 NOTE — Therapy (Signed)
OUTPATIENT PHYSICAL THERAPY LOWER EXTREMITY EVALUATION   Patient Name: Kathryn Riggs MRN: [id number] DOB:[birth year], 50 y.o., female Today's Date: 11/08/2021   PT End of Session - 11/08/21 0939     Visit Number 1    Number of Visits 17    Date for PT Re-Evaluation 02/06/22    Authorization Type [employer]    PT Start Time 0805    PT Stop Time 0845    PT Time Calculation (min) 40 min    Activity Tolerance Patient tolerated treatment well;Patient limited by pain    Behavior During Therapy Anxious             Past Medical History:  Diagnosis Date   Anemia    DDD (degenerative disc disease), cervical    Ehlers-Danlos syndrome    Facial trauma    Fatigue    Herniated disc, cervical    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    Lower extremity neuropathy    [facility] spotted fever    treated for this in 2017    Temporomandibular joint disorder    Past Surgical History:  Procedure Laterality Date   epiosotomy  2007   Patient Active Problem List   Diagnosis Date Noted   Headache 10/29/2016   Dizziness and giddiness 10/29/2016   Insomnia 05/14/2016   Bruxism, sleep-related 05/14/2016   Masticatory myalgia 04/30/2016   Muscle pain 04/30/2016   Achilles tendinosis 04/30/2016    PCP: Ladora Daniel, PA-C  REFERRING PROVIDER: Jacinta Shoe, PA-C  REFERRING DIAG: [phone] (ICD-10-CM) - Achilles tendinitis, left leg  THERAPY DIAG:  Pain in left ankle and joints of left foot  Muscle weakness (generalized)  Difficulty walking  ONSET DATE: 09/2021  SUBJECTIVE:   SUBJECTIVE STATEMENT: Pt states she has had frequent history of ankle sprains within the last year. Reports most recent MRI shows tendonosis, peroneal tendonosis, and plantar fascitis. Her most recent sprains happened while waking out of a restaurants, catching on a rug, and tripping over a cord at home. She feels walking short distances is fine but prolonged  periods of being on feet aggravate it, especially standing. She states she had troulbe standing for 10 mins due to pain in the ankle at her child's school event. She states she has an ankle lacing brace at home but does not wear it currently. Pt denies localized inflammation. Denies systemic symptoms and cancer red flags. Pt has had PT in the past and is doing a few exercises from previous episodes.  - Doing seated HR/TR and standing HR; resisted ankle motions, doming, towel/toe curls; self massaging calves  -was doing pool workouts but has stopped over a year ago  PERTINENT HISTORY: POTS, EDS, AA instability, Recurrent L ankle sprains- 4 in the last year; R knee pain  PAIN:  Are you having pain? Yes VAS scale: 2/10 Pain location: Achilles and top of foot Pain orientation: Left  Pain description: burning and stinging   Aggravating factors: standing for too long <79mins,  coming downstairs  Relieving factors: ice, acupuncture, topical ointments  PRECAUTIONS: None  WEIGHT BEARING RESTRICTIONS No  FALLS:  Has patient fallen in last 6 months? 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    OBJECTIVE:     PATIENT SURVEYS:  LEFS 35 / 80 = 43.8 %  COGNITION:  Overall cognitive status: Within functional limits for tasks assessed     SENSATION:  Light touch: Appears intact  POSTURE:  Toe out, genu valgus  LE AROM/PROM:  A/PROM Right 11/08/2021 Left 11/08/2021  Ankle dorsiflexion [facility] WFL  Ankle plantarflexion WFL WFL  Ankle inversion [facility] WFL  Ankle eversion WFL WFL   (Blank rows = not tested)  LE MMT:  Untested, pt requests to not test strength due to pain  LOWER EXTREMITY SPECIAL TESTS:   Ankle special tests: Anterior drawer test: positive  and Talar tilt test: positive   Hypermobility noted in L ankle, likely with EDS as contributing factor in joint laxity  JOINT MOBILITY ASSESSMENT:  Hypermobile at TCJ and subtalar with manual glides TTP globally of tendons, especially Achilles just superior to distal insertion site  FUNCTIONAL TESTS:  Squatting:  DL knee bend with significant apprehension, pt request to not perform squat due to previous history of pain. Step up/down: UE support needed, decreased toe off bilaterally; decreased eccentric lowering control; limited DF/early heel off with descent  GAIT: Distance walked: 9ft Assistive device utilized: None Level of assistance: Complete Independence Comments: instability noted on L with midstance, decreased L stance time, lack of distinct toe off and heel strike  TODAY'S TREATMENT:  Exercises Tandem Stance - 2 x daily - 7 x weekly - 1 sets - 5 reps - 20 hold Woodpeckers Two Legs - 1 x daily - 7 x weekly - 2 sets - 10 reps - 3 hold  Edu given about home/self modifications if pain is increased    PATIENT EDUCATION:  Education details: acceptable levels of pain, risk factors of recurrent ankle sprains, usage of brace with outdoor/uneven walking MOI, diagnosis, prognosis, anatomy, exercise progression, DOMS expectations, muscle firing,  envelope of function, HEP, POC  Person educated: Patient Education method: Explanation, Demonstration, Tactile cues, Verbal cues, and Handouts Education comprehension: verbalized understanding, returned demonstration, verbal cues required, tactile cues required, and needs further education   HOME EXERCISE PROGRAM: Access Code: [email or web address] URL: [email or web address] Date: 11/08/2021 Prepared by: Zebedee Iba  ASSESSMENT:  CLINICAL IMPRESSION: Patient is a 50 y.o. female who was seen today for physical therapy evaluation and treatment for CC of L achilles pain.  Pt's s/s  of the L ankle appear consistent with globalized tendinopathies due to significant history of recurrent ankle sprains. Due to pt's EDS, joint mobility measurements appear WNL but pt has increased pain, apprehension, and fear avoidance patterns with LE movements- especially with CKC. Pt currently seeing specialist for her AA instability, no signs of 5D's or 3N's during session today. Pt will require continued reinforcement for reducing fear avoidance behavior, as well as careful consideration of contralateral orthopedic issues. Objective impairments include Abnormal gait, decreased activity tolerance, decreased balance, decreased endurance, decreased mobility, difficulty walking, decreased strength, increased muscle spasms, improper body mechanics, pain, and hypermobility disorder . These impairments are limiting patient from cleaning, community activity, yard work, shopping, and caregiver duties, exercise, and recreation . Personal factors including Behavior pattern, Fitness, Past/current experiences, Time since onset of injury/illness/exacerbation, and 3+ comorbidities:    are also affecting patient's functional outcome. Patient will benefit from skilled PT to address above impairments and improve overall function.  REHAB POTENTIAL: Fair    CLINICAL DECISION MAKING: Evolving/moderate complexity  EVALUATION COMPLEXITY: Moderate   GOALS:   SHORT TERM  GOALS:  STG Name Target Date Goal status  1 Pt will become independent with HEP in order to demonstrate synthesis of PT education.  11/22/2021 INITIAL  2 Pt will report at least 2 pt reduction on NPRS scale for pain in order to demonstrate functional improvement with household activity, self care, and ADL.   12/20/2021 INITIAL  3 Pt will be able to demonstrate full STS without UE support in order to demonstrate functional improvement in LE function for self-care and house hold duties.   12/20/2021 INITIAL  4 Pt will have an at least 9 pt improvement  in LEFS measure in order to demonstrate MCID improvement in daily function.   12/20/2021 INITIAL   LONG TERM GOALS:   LTG Name Target Date Goal status  1 Pt  will become independent with final HEP in order to demonstrate synthesis of PT education.  01/31/2022 INITIAL  2 Pt will be able to perform 5XSTS in under 12s  in order to demonstrate functional improvement above the cut off score for adults.   01/31/2022 INITIAL  3 Pt will be able to demonstrate ability to perform stair with reciprocal pattern and no UE support in order to demonstrate functional improvement in LE function for self-care and house hold mobility.   01/31/2022 INITIAL  4 Pt will be able to demonstrate/report ability to walk >15 mins without pain in order to demonstrate functional improvement and tolerance to exercise and community mobility.   01/31/2022 INITIAL   PLAN: PT FREQUENCY: 1-2x/week  PT DURATION: 12 weeks (likely D/C by 10)  PLANNED INTERVENTIONS: Therapeutic exercises, Therapeutic activity, Neuro Muscular re-education, Balance training, Gait training, Patient/Family education, Joint mobilization, Stair training, Prosthetic training, Aquatic Therapy, Dry Needling, Electrical stimulation, Spinal mobilization, Cryotherapy, Moist heat, Compression bandaging, Taping, Vasopneumatic device, Traction, Ultrasound, Ionotophoresis 4mg /ml Dexamethasone, and Manual therapy  PLAN FOR NEXT SESSION: review HEP, continue with ankle stability training, NBOS regression if semi-tandem/tandem too painful, assess standing HR tolerance; STS from tall table height, sidestepping  PT, DPT 11/08/21 10:02 AM

## 2021-11-10 ENCOUNTER — Encounter (HOSPITAL_BASED_OUTPATIENT_CLINIC_OR_DEPARTMENT_OTHER): Payer: Self-pay | Admitting: Physical Therapy

## 2021-11-15 ENCOUNTER — Ambulatory Visit (HOSPITAL_BASED_OUTPATIENT_CLINIC_OR_DEPARTMENT_OTHER): Admitting: Physical Therapy

## 2021-11-20 ENCOUNTER — Ambulatory Visit (HOSPITAL_BASED_OUTPATIENT_CLINIC_OR_DEPARTMENT_OTHER): Admitting: Physical Therapy

## 2021-11-20 ENCOUNTER — Other Ambulatory Visit: Payer: Self-pay

## 2021-11-20 ENCOUNTER — Encounter (HOSPITAL_BASED_OUTPATIENT_CLINIC_OR_DEPARTMENT_OTHER): Payer: Self-pay | Admitting: Physical Therapy

## 2021-11-20 DIAGNOSIS — M25572 Pain in left ankle and joints of left foot: Secondary | ICD-10-CM

## 2021-11-20 DIAGNOSIS — R262 Difficulty in walking, not elsewhere classified: Secondary | ICD-10-CM

## 2021-11-20 DIAGNOSIS — M6281 Muscle weakness (generalized): Secondary | ICD-10-CM

## 2021-11-20 NOTE — Therapy (Signed)
OUTPATIENT PHYSICAL THERAPY TREATMENT NOTE   Patient Name: Kathryn Riggs MRN: [id number] DOB:[birth year], 50 y.o., female Today's Date: 11/20/2021  PCP: Ladora Daniel, PA-C REFERRING PROVIDER: Ladora Daniel, PA-C   PT End of Session - 11/20/21 1046     Visit Number 2    Number of Visits 17    Date for PT Re-Evaluation 02/06/22    Authorization Type [employer]    PT Start Time 0946    PT Stop Time 1030    PT Time Calculation (min) 44 min    Activity Tolerance Patient tolerated treatment well;Patient limited by pain    Behavior During Therapy Anxious             Past Medical History:  Diagnosis Date   Anemia    DDD (degenerative disc disease), cervical    Ehlers-Danlos syndrome    Facial trauma    Fatigue    Herniated disc, cervical    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    Lower extremity neuropathy    [facility] spotted fever    treated for this in 2017    Temporomandibular joint disorder    Past Surgical History:  Procedure Laterality Date   epiosotomy  2007   Patient Active Problem List   Diagnosis Date Noted   Headache 10/29/2016   Dizziness and giddiness 10/29/2016   Insomnia 05/14/2016   Bruxism, sleep-related 05/14/2016   Masticatory myalgia 04/30/2016   Muscle pain 04/30/2016   Achilles tendinosis 04/30/2016    REFERRING DIAG:  M76.62 (ICD-10-CM) - Achilles tendinitis, left leg   THERAPY DIAG:  Pain in left ankle and joints of left foot  Muscle weakness (generalized)  Difficulty walking  PERTINENT HISTORY: POTS, EDS, AA instability, Recurrent L ankle sprains- 4 in the last year; R knee pain  PRECAUTIONS: none  PAIN:  Are you having pain? Yes VAS scale: 2/10 Pain location: Achilles and top of foot Pain orientation: Left  Pain description: burning and stinging   Aggravating factors: standing for too long <28mins,  coming downstairs  Relieving factors: ice, acupuncture, topical  ointments   SUBJECTIVE: "my foot and ankle feel ok today"   PAIN: 11/28 Are you having pain? Yes VAS scale: 2/10 Pain location: left ankle and foot Pain orientation: Left  PAIN TYPE: aching Pain description: intermittent  Aggravating factors: walking Relieving factors: resting   OBJECTIVE:     HOME EXERCISE PROGRAM: Access Code: [email or web address] URL: [email or web address] Date: 11/08/2021 Prepared by: Zebedee Iba   ASSESSMENT:   CLINICAL IMPRESSION:  Pt without apprehension in environment.  She is instructed through stretching and exercising in various depths and positions.  She is well educated on her Dx.  She knows what positions or activities may cause increased discomfort and is hesitant to complete some exercises. Addressed her fear avoidance and instructed her that there may be some discomfort in ankle/foot with rehab. Most of treatment completed in 4 to 4.8 ft. She is limited by pain, not just in foot and ankle, (other chronic conditions throughout spine and pelvis) completing minimal repetitions as tolerated.    Patient is a 50 y.o. female who was seen today for physical therapy evaluation and treatment for CC of L achilles pain.  Pt's s/s of the L ankle appear consistent with globalized tendinopathies due to significant history of recurrent ankle sprains. Due to pt's EDS, joint mobility measurements appear WNL but pt has increased pain, apprehension, and fear avoidance patterns with LE movements-  especially with CKC. Pt currently seeing specialist for her AA instability, no signs of 5D's or 3N's during session today. Pt will require continued reinforcement for reducing fear avoidance behavior, as well as careful consideration of contralateral orthopedic issues.    Objective impairments include Abnormal gait, decreased activity tolerance, decreased balance, decreased endurance, decreased mobility, difficulty walking, decreased strength, increased muscle spasms, improper  body mechanics, pain, and hypermobility disorder . These impairments are limiting patient from cleaning, community activity, yard work, shopping, and caregiver duties, exercise, and recreation . Personal factors including Behavior pattern, Fitness, Past/current experiences, Time since onset of injury/illness/exacerbation, and 3+ comorbidities:    are also affecting patient's functional outcome. Patient will benefit from skilled PT to address above impairments and improve overall function.   REHAB POTENTIAL: Fair     CLINICAL DECISION MAKING: Evolving/moderate complexity   EVALUATION COMPLEXITY: Moderate     GOALS:     SHORT TERM GOALS:   STG Name Target Date Goal status  1 Pt will become independent with HEP in order to demonstrate synthesis of PT education.   11/22/2021 INITIAL  2 Pt will report at least 2 pt reduction on NPRS scale for pain in order to demonstrate functional improvement with household activity, self care, and ADL.    12/20/2021 INITIAL  3 Pt will be able to demonstrate full STS without UE support in order to demonstrate functional improvement in LE function for self-care and house hold duties.    12/20/2021 INITIAL  4 Pt will have an at least 9 pt improvement in LEFS measure in order to demonstrate MCID improvement in daily function.    12/20/2021 INITIAL    LONG TERM GOALS:    LTG Name Target Date Goal status  1 Pt  will become independent with final HEP in order to demonstrate synthesis of PT education.   01/31/2022 INITIAL  2 Pt will be able to perform 5XSTS in under 12s  in order to demonstrate functional improvement above the cut off score for adults.     01/31/2022 INITIAL  3 Pt will be able to demonstrate ability to perform stair with reciprocal pattern and no UE support in order to demonstrate functional improvement in LE function for self-care and house hold mobility.     01/31/2022 INITIAL  4 Pt will be able to demonstrate/report ability to walk >15 mins  without pain in order to demonstrate functional improvement and tolerance to exercise and community mobility.    01/31/2022 INITIAL         TODAY'S TREATMENT: 11/28 Pt seen for aquatic therapy today.  Treatment took place in water 3.25-4.8 ft in depth at the [facility] pool. Temp of water was 91.  Pt entered/exited the pool via stairs step to pattern independently with bilat rail.  Pt introduced to setting.  Warm up: forward, backward and side stepping/walking cues for increased step length, increased speed, hand placement to increase resistance. Standing 4-4.34ft -gastroc and anterior tib stretching -tr/hr  -Long leg hip flex; ext; add/abd x up to 10 reps. Initially supported by hand buoys but then without (caused cervical and shoulder discomfort) -squats (~1/3) cues for weight through heels  Walking with 1 foam hand buoys submerged at hip x 6 widths forward and back.  Pt requires buoyancy for support and to offload joints with strengthening exercises. Viscosity of the water is needed for resistance of strengthening; water current perturbations provides challenge to standing balance unsupported, requiring increased core activation.     PATIENT EDUCATION:  Education details: acceptable levels of pain, risk factors of recurrent ankle sprains, usage of brace with outdoor/uneven walking MOI, diagnosis, prognosis, anatomy, exercise progression, DOMS expectations, muscle firing,  envelope of function, HEP, POC   Person educated: Patient Education method: Explanation, Demonstration, Tactile cues, Verbal cues, and Handouts Education comprehension: verbalized understanding, returned demonstration, verbal cues required, tactile cues required, and needs further education 11/28: properties of water, benefits of aquatic therapy PLAN: PT FREQUENCY: 1-2x/week   PT DURATION: 12 weeks (likely D/C by 10)   PLANNED INTERVENTIONS: Therapeutic exercises, Therapeutic activity, Neuro Muscular  re-education, Balance training, Gait training, Patient/Family education, Joint mobilization, Stair training, Prosthetic training, Aquatic Therapy, Dry Needling, Electrical stimulation, Spinal mobilization, Cryotherapy, Moist heat, Compression bandaging, Taping, Vasopneumatic device, Traction, Ultrasound, Ionotophoresis 4mg /ml Dexamethasone, and Manual therapy   PLAN FOR NEXT SESSION: review HEP, continue with ankle stability training, NBOS regression if semi-tandem/tandem too painful, assess standing HR tolerance; STS from tall table height, sidestepping   MPT 11/20/2021, 1:32 PM

## 2021-11-22 ENCOUNTER — Ambulatory Visit (HOSPITAL_BASED_OUTPATIENT_CLINIC_OR_DEPARTMENT_OTHER): Admitting: Physical Therapy

## 2021-11-22 ENCOUNTER — Encounter (HOSPITAL_BASED_OUTPATIENT_CLINIC_OR_DEPARTMENT_OTHER): Payer: Self-pay | Admitting: Physical Therapy

## 2021-11-22 ENCOUNTER — Other Ambulatory Visit: Payer: Self-pay

## 2021-11-22 DIAGNOSIS — M6281 Muscle weakness (generalized): Secondary | ICD-10-CM

## 2021-11-22 DIAGNOSIS — M25572 Pain in left ankle and joints of left foot: Secondary | ICD-10-CM | POA: Diagnosis not present

## 2021-11-22 DIAGNOSIS — R262 Difficulty in walking, not elsewhere classified: Secondary | ICD-10-CM

## 2021-11-22 NOTE — Therapy (Signed)
OUTPATIENT PHYSICAL THERAPY TREATMENT NOTE   Patient Name: Neidra Girvan MRN: [id number] DOB:May 21, 1971, 50 y.o., female Today's Date: 11/22/2021  PCP: Ladora Daniel, PA-C REFERRING PROVIDER: Ladora Daniel, PA-C   PT End of Session - 11/22/21 1115     Visit Number 3    Number of Visits 17    Date for PT Re-Evaluation 02/06/22    Authorization Type [employer]    PT Start Time 1115    PT Stop Time 1210    PT Time Calculation (min) 55 min    Activity Tolerance Patient tolerated treatment well;Patient limited by pain    Behavior During Therapy Anxious             Past Medical History:  Diagnosis Date   Anemia    DDD (degenerative disc disease), cervical    Ehlers-Danlos syndrome    Facial trauma    Fatigue    Herniated disc, cervical    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    Lower extremity neuropathy    [facility] spotted fever    treated for this in 2017    Temporomandibular joint disorder    Past Surgical History:  Procedure Laterality Date   epiosotomy  2007   Patient Active Problem List   Diagnosis Date Noted   Headache 10/29/2016   Dizziness and giddiness 10/29/2016   Insomnia 05/14/2016   Bruxism, sleep-related 05/14/2016   Masticatory myalgia 04/30/2016   Muscle pain 04/30/2016   Achilles tendinosis 04/30/2016    REFERRING DIAG:  M76.62 (ICD-10-CM) - Achilles tendinitis, left leg   THERAPY DIAG:  Pain in left ankle and joints of left foot  Muscle weakness (generalized)  Difficulty walking  PERTINENT HISTORY: POTS, EDS, AA instability, Recurrent L ankle sprains- 4 in the last year; R knee pain  PRECAUTIONS: none  PAIN:  Are you having pain? Yes VAS scale: 2/10 Pain location: Achilles and top of foot Pain orientation: Left  Pain description: burning and stinging   Aggravating factors: standing for too long <97mins,  coming downstairs  Relieving factors: ice, acupuncture, topical  ointments   SUBJECTIVE: "Left foot hurt a bit getting into pool"   PAIN: 11/28 Are you having pain? Yes VAS scale: 2/10 Pain location: left ankle and foot Pain orientation: Left  PAIN TYPE: aching Pain description: intermittent  Aggravating factors: walking Relieving factors: resting   OBJECTIVE:     HOME EXERCISE PROGRAM: Access Code: [email or web address] URL: [email or web address] Date: 11/08/2021 Prepared by: Zebedee Iba   ASSESSMENT:   CLINICAL IMPRESSION:  Pt with complaints of left ankle discomfort after gentle stretching in seated position.  Pt directed through multiple exercises with concern throughout of all other comorbidities and chronic pain.  We are having difficulty finding exercises she can tolerate.  She tolerates vertical postioning and exercises in forward/backward plane 80% submerged best. Supine and prone suspension create cervical and shoulder pain. Less submersion increases knee/ankle and foot pain as well as increases pts apprehension with completion of exercises. She also does not tolerate the slight angle of the pool as it gets deeper so needs to be on water step in 4 foot for exercise. Pt has experience with water exercise, bringing her own water paddles today. She does complete warm up indep.   Patient is a 50 y.o. female who was seen today for physical therapy evaluation and treatment for CC of L achilles pain.  Pt's s/s of the L ankle appear consistent with globalized tendinopathies  due to significant history of recurrent ankle sprains. Due to pt's EDS, joint mobility measurements appear WNL but pt has increased pain, apprehension, and fear avoidance patterns with LE movements- especially with CKC. Pt currently seeing specialist for her AA instability, no signs of 5D's or 3N's during session today. Pt will require continued reinforcement for reducing fear avoidance behavior, as well as careful consideration of contralateral orthopedic issues.     Objective impairments include Abnormal gait, decreased activity tolerance, decreased balance, decreased endurance, decreased mobility, difficulty walking, decreased strength, increased muscle spasms, improper body mechanics, pain, and hypermobility disorder . These impairments are limiting patient from cleaning, community activity, yard work, shopping, and caregiver duties, exercise, and recreation . Personal factors including Behavior pattern, Fitness, Past/current experiences, Time since onset of injury/illness/exacerbation, and 3+ comorbidities:    are also affecting patient's functional outcome. Patient will benefit from skilled PT to address above impairments and improve overall function.   REHAB POTENTIAL: Fair     CLINICAL DECISION MAKING: Evolving/moderate complexity   EVALUATION COMPLEXITY: Moderate     GOALS:     SHORT TERM GOALS:   STG Name Target Date Goal status  1 Pt will become independent with HEP in order to demonstrate synthesis of PT education.   11/22/2021 INITIAL  2 Pt will report at least 2 pt reduction on NPRS scale for pain in order to demonstrate functional improvement with household activity, self care, and ADL.    12/20/2021 INITIAL  3 Pt will be able to demonstrate full STS without UE support in order to demonstrate functional improvement in LE function for self-care and house hold duties.    12/20/2021 INITIAL  4 Pt will have an at least 9 pt improvement in LEFS measure in order to demonstrate MCID improvement in daily function.    12/20/2021 INITIAL    LONG TERM GOALS:    LTG Name Target Date Goal status  1 Pt  will become independent with final HEP in order to demonstrate synthesis of PT education.   01/31/2022 INITIAL  2 Pt will be able to perform 5XSTS in under 12s  in order to demonstrate functional improvement above the cut off score for adults.     01/31/2022 INITIAL  3 Pt will be able to demonstrate ability to perform stair with reciprocal  pattern and no UE support in order to demonstrate functional improvement in LE function for self-care and house hold mobility.     01/31/2022 INITIAL  4 Pt will be able to demonstrate/report ability to walk >15 mins without pain in order to demonstrate functional improvement and tolerance to exercise and community mobility.    01/31/2022 INITIAL         TODAY'S TREATMENT: 11/30 Pt seen for aquatic therapy today.  Treatment took place in water 3.25-4.8 ft in depth at the [facility] pool. Temp of water was 91.  Pt entered/exited the pool via stairs step to pattern independently with bilat rail.  Pt introduced to setting.  Warm up: forward, backward and side stepping/walking cues for increased step length, increased speed, hand placement to increase resistance. Standing 4-4.29ft -gastroc and anterior tib stretching indep and manually (sitting) -tr/hr  -Long leg hip flex; ext; add/abd x up to 10 reps standing on water step in 4.8 foot -cycling straddling noodle  Attempted modified plank for core strengthening but not tolerated  Pt requires buoyancy for support and to offload joints with strengthening exercises. Viscosity of the water is needed for resistance of strengthening; water  current perturbations provides challenge to standing balance unsupported, requiring increased core activation.     PATIENT EDUCATION:  Education details: acceptable levels of pain, risk factors of recurrent ankle sprains, usage of brace with outdoor/uneven walking MOI, diagnosis, prognosis, anatomy, exercise progression, DOMS expectations, muscle firing,  envelope of function, HEP, POC   Person educated: Patient Education method: Explanation, Demonstration, Tactile cues, Verbal cues, and Handouts Education comprehension: verbalized understanding, returned demonstration, verbal cues required, tactile cues required, and needs further education 11/28: properties of water, benefits of aquatic  therapy PLAN: PT FREQUENCY: 1-2x/week   PT DURATION: 12 weeks (likely D/C by 10)   PLANNED INTERVENTIONS: Therapeutic exercises, Therapeutic activity, Neuro Muscular re-education, Balance training, Gait training, Patient/Family education, Joint mobilization, Stair training, Prosthetic training, Aquatic Therapy, Dry Needling, Electrical stimulation, Spinal mobilization, Cryotherapy, Moist heat, Compression bandaging, Taping, Vasopneumatic device, Traction, Ultrasound, Ionotophoresis 4mg /ml Dexamethasone, and Manual therapy   PLAN FOR NEXT SESSION: review HEP, continue with ankle stability training, NBOS regression if semi-tandem/tandem too painful, assess standing HR tolerance; STS from tall table height, sidestepping.  Aquatics: kick board core strengtheing   MPT 11/22/2021, 1:40 PM

## 2021-11-29 ENCOUNTER — Encounter (HOSPITAL_BASED_OUTPATIENT_CLINIC_OR_DEPARTMENT_OTHER): Payer: Self-pay | Admitting: Physical Therapy

## 2021-11-29 ENCOUNTER — Ambulatory Visit (HOSPITAL_BASED_OUTPATIENT_CLINIC_OR_DEPARTMENT_OTHER): Admitting: Physical Therapy

## 2021-11-30 ENCOUNTER — Ambulatory Visit (HOSPITAL_BASED_OUTPATIENT_CLINIC_OR_DEPARTMENT_OTHER): Admitting: Physical Therapy

## 2021-12-04 ENCOUNTER — Ambulatory Visit (HOSPITAL_BASED_OUTPATIENT_CLINIC_OR_DEPARTMENT_OTHER): Admitting: Physical Therapy

## 2021-12-06 ENCOUNTER — Ambulatory Visit (HOSPITAL_BASED_OUTPATIENT_CLINIC_OR_DEPARTMENT_OTHER): Admitting: Physical Therapy

## 2021-12-11 ENCOUNTER — Ambulatory Visit (HOSPITAL_BASED_OUTPATIENT_CLINIC_OR_DEPARTMENT_OTHER): Admitting: Physical Therapy

## 2021-12-13 ENCOUNTER — Ambulatory Visit (HOSPITAL_BASED_OUTPATIENT_CLINIC_OR_DEPARTMENT_OTHER): Admitting: Physical Therapy

## 2021-12-22 ENCOUNTER — Ambulatory Visit (HOSPITAL_BASED_OUTPATIENT_CLINIC_OR_DEPARTMENT_OTHER): Admitting: Physical Therapy

## 2021-12-26 ENCOUNTER — Ambulatory Visit (HOSPITAL_BASED_OUTPATIENT_CLINIC_OR_DEPARTMENT_OTHER): Admitting: Physical Therapy

## 2021-12-28 ENCOUNTER — Ambulatory Visit (HOSPITAL_BASED_OUTPATIENT_CLINIC_OR_DEPARTMENT_OTHER): Admitting: Physical Therapy

## 2022-01-02 ENCOUNTER — Ambulatory Visit (HOSPITAL_BASED_OUTPATIENT_CLINIC_OR_DEPARTMENT_OTHER): Admitting: Physical Therapy

## 2022-01-04 ENCOUNTER — Ambulatory Visit (HOSPITAL_BASED_OUTPATIENT_CLINIC_OR_DEPARTMENT_OTHER): Admitting: Physical Therapy

## 2022-01-09 ENCOUNTER — Ambulatory Visit (HOSPITAL_BASED_OUTPATIENT_CLINIC_OR_DEPARTMENT_OTHER): Admitting: Physical Therapy

## 2022-01-11 ENCOUNTER — Ambulatory Visit (HOSPITAL_BASED_OUTPATIENT_CLINIC_OR_DEPARTMENT_OTHER): Admitting: Physical Therapy

## 2022-09-19 ENCOUNTER — Ambulatory Visit: Admitting: Podiatry

## 2022-10-12 ENCOUNTER — Ambulatory Visit: Admitting: Podiatry

## 2022-11-24 ENCOUNTER — Ambulatory Visit: Admitting: Podiatry

## 2022-11-24 ENCOUNTER — Encounter: Payer: Self-pay | Admitting: Podiatry

## 2022-11-24 DIAGNOSIS — M79671 Pain in right foot: Secondary | ICD-10-CM | POA: Diagnosis not present

## 2022-11-24 DIAGNOSIS — L84 Corns and callosities: Secondary | ICD-10-CM

## 2022-11-24 NOTE — Progress Notes (Signed)
Subjective:   Patient ID: Kathryn Riggs, female   DOB: 51 y.o.   MRN: [id number]   HPI Chief Complaint  Patient presents with   Foot Pain    "The side of my heels hurt on the side." N - heel pain  L - lateral b/l D - 2 mos O - suddenly C - tender A - pressure, sleeping on it T - use pillows in between my knees,    51 year old female presents with above concerns.  She states that she had a small fissure. She tries to file the skin. She uses a foot lotion to the area. It does feel better but it comes back.  No open lesions she reports.  No recent injuries to this area.  She has chronic ankle instablity and neuropathy and is treated by Dr. Victorino Dike for this.   Review of Systems  All other systems reviewed and are negative.       Objective:  Physical Exam  General: AAO x3, NAD  Dermatological: Hyperkeratotic tissue, dry skin present on lateral aspect of right heel ulcer recently has trended down.  There is no open lesion.  Mild bony prominence is present underneath this.  She started get similar symptoms on the contralateral extremity but not as severe.    Vascular: Dorsalis Pedis artery and Posterior Tibial artery pedal pulses are 2/4 bilateral with immedate capillary fill time. There is no pain with calf compression, swelling, warmth, erythema.   Neruologic: Grossly intact via light touch bilateral.   Musculoskeletal: There is no area pinpoint tenderness.  Flexor, extensor tendons.  Intact.  No edema, erythema  Gait: Unassisted, Nonantalgic.     Assessment:   51 year old female with hyperkeratotic lesion     Plan:  -Treatment options discussed including all alternatives, risks, and complications -Etiology of symptoms were discussed -We discussed Epsom moisturizer to apply including urea cream.  She is hesitant to do that so working on different moisturizers on a regular basis.  She will continue with pumice stone as well.  We discussed heel pillows for which she is on  balance that seem to be causing some pressure.  Foam heel cups.  We have some today however they unfortunately were expired.  Vivi Barrack DPM

## 2023-01-03 ENCOUNTER — Encounter: Payer: Self-pay | Admitting: Podiatry

## 2023-01-10 ENCOUNTER — Ambulatory Visit: Admitting: Podiatry

## 2023-07-26 ENCOUNTER — Other Ambulatory Visit (HOSPITAL_COMMUNITY): Payer: Self-pay | Admitting: Family Medicine

## 2023-07-26 DIAGNOSIS — E041 Nontoxic single thyroid nodule: Secondary | ICD-10-CM

## 2023-08-09 ENCOUNTER — Ambulatory Visit (HOSPITAL_BASED_OUTPATIENT_CLINIC_OR_DEPARTMENT_OTHER)

## 2023-08-15 ENCOUNTER — Ambulatory Visit (HOSPITAL_BASED_OUTPATIENT_CLINIC_OR_DEPARTMENT_OTHER)

## 2023-09-04 ENCOUNTER — Ambulatory Visit (HOSPITAL_BASED_OUTPATIENT_CLINIC_OR_DEPARTMENT_OTHER): Admission: RE | Admit: 2023-09-04 | Source: Ambulatory Visit

## 2023-09-13 ENCOUNTER — Ambulatory Visit (HOSPITAL_BASED_OUTPATIENT_CLINIC_OR_DEPARTMENT_OTHER): Admission: RE | Admit: 2023-09-13 | Source: Ambulatory Visit

## 2023-09-13 ENCOUNTER — Encounter (HOSPITAL_BASED_OUTPATIENT_CLINIC_OR_DEPARTMENT_OTHER): Payer: Self-pay

## 2024-04-25 ENCOUNTER — Emergency Department (HOSPITAL_BASED_OUTPATIENT_CLINIC_OR_DEPARTMENT_OTHER)
Admission: EM | Admit: 2024-04-25 | Discharge: 2024-04-25 | Disposition: A | Discharge status: Home / Self Care | Attending: Emergency Medicine | Admitting: Emergency Medicine

## 2024-04-25 ENCOUNTER — Encounter (HOSPITAL_BASED_OUTPATIENT_CLINIC_OR_DEPARTMENT_OTHER): Payer: Self-pay

## 2024-04-25 ENCOUNTER — Emergency Department (HOSPITAL_BASED_OUTPATIENT_CLINIC_OR_DEPARTMENT_OTHER): Admitting: Radiology

## 2024-04-25 ENCOUNTER — Other Ambulatory Visit: Payer: Self-pay

## 2024-04-25 DIAGNOSIS — E871 Hypo-osmolality and hyponatremia: Secondary | ICD-10-CM | POA: Insufficient documentation

## 2024-04-25 DIAGNOSIS — R6884 Jaw pain: Secondary | ICD-10-CM | POA: Diagnosis not present

## 2024-04-25 DIAGNOSIS — R42 Dizziness and giddiness: Secondary | ICD-10-CM | POA: Insufficient documentation

## 2024-04-25 DIAGNOSIS — R002 Palpitations: Secondary | ICD-10-CM | POA: Diagnosis present

## 2024-04-25 DIAGNOSIS — R197 Diarrhea, unspecified: Secondary | ICD-10-CM | POA: Diagnosis not present

## 2024-04-25 DIAGNOSIS — M542 Cervicalgia: Secondary | ICD-10-CM | POA: Diagnosis not present

## 2024-04-25 DIAGNOSIS — R251 Tremor, unspecified: Secondary | ICD-10-CM | POA: Diagnosis not present

## 2024-04-25 DIAGNOSIS — R110 Nausea: Secondary | ICD-10-CM | POA: Diagnosis not present

## 2024-04-25 LAB — COMPREHENSIVE METABOLIC PANEL WITH GFR
ALT: 17 U/L (ref 0–44)
AST: 21 U/L (ref 15–41)
Albumin: 4.5 g/dL (ref 3.5–5.0)
Alkaline Phosphatase: 46 U/L (ref 38–126)
Anion gap: 11 (ref 5–15)
BUN: 14 mg/dL (ref 6–20)
CO2: 24 mmol/L (ref 22–32)
Calcium: 9.3 mg/dL (ref 8.9–10.3)
Chloride: 96 mmol/L — ABNORMAL LOW (ref 98–111)
Creatinine, Ser: 0.76 mg/dL (ref 0.44–1.00)
GFR, Estimated: >60 mL/min (ref >60)
Glucose, Bld: 112 mg/dL — ABNORMAL HIGH (ref 70–99)
Potassium: 3.6 mmol/L (ref 3.5–5.1)
Sodium: 131 mmol/L — ABNORMAL LOW (ref 135–145)
Total Bilirubin: <0.2 mg/dL (ref 0.0–1.2)
Total Protein: 6.9 g/dL (ref 6.5–8.1)

## 2024-04-25 LAB — CBC
HCT: 37.5 % (ref 36.0–46.0)
Hemoglobin: 12.5 g/dL (ref 12.0–15.0)
MCH: 29.5 pg (ref 26.0–34.0)
MCHC: 33.3 g/dL (ref 30.0–36.0)
MCV: 88.4 fL (ref 80.0–100.0)
Platelets: 285 10*3/uL (ref 150–400)
RBC: 4.24 MIL/uL (ref 3.87–5.11)
RDW: 12.6 % (ref 11.5–15.5)
WBC: 6.6 10*3/uL (ref 4.0–10.5)
nRBC: 0 % (ref 0.0–0.2)

## 2024-04-25 LAB — TROPONIN T, HIGH SENSITIVITY
Troponin T High Sensitivity: <15 ng/L (ref <19)
Troponin T High Sensitivity: <15 ng/L (ref <19)

## 2024-04-25 LAB — PREGNANCY, URINE: Preg Test, Ur: NEGATIVE

## 2024-04-25 NOTE — ED Triage Notes (Signed)
 Patient presents with c/o palpitations that started today +associated dizziness. Denies chest pain, shortness of breath, nausea, vomiting, numbness, tingling

## 2024-04-25 NOTE — ED Notes (Addendum)
 Reviewed discharge instructions and follow up care with pt. Pt verbalized understanding and had no further questions. Pt exited ED without complications.

## 2024-04-25 NOTE — ED Provider Notes (Signed)
 [place] [facility] AT [facility] Provider Note   CSN: [id number] Arrival date & time: 04/25/24  1636     History {Add pertinent medical, surgical, social history, OB history to HPI:1} Chief Complaint  Patient presents with   Palpitations    Kathryn Riggs is a 53 y.o. female.  HPI      53 year old female with a history of anemia, Ehlers-Danlos syndrome, history of [place] spotted fever, neuropathy, TMJ disorder  Was upset today, fetl shaky, did not drink water today (usually stays hydrated due to history of mild dysautonomia), then drank water, then a little bit of salt in it,  BP was on high end for her--bp not calibrated was 143/90, 150s, tried breathing techniques to lower blood pressure that didn't help  Feeling shakiness, and pressure through side of neck on the right side, go to a PT locally and PT in [facility] because she has EDS and they are specialist. Had seen her 2 weeks ago, did some different exercises, then a few days later felt like jaw tightened up, and was not able to get it calmed down, did acupuncture to to help.  Jaw symptoms had improved. Had contacted her PT In [facility] about the symptoms of dizziness, jaw discomfort.  Hx of jaw problems has been on and off, 2 weeks ago was under more stress.  Had US  done of carotid with neurologist. Had echo in March at [facility], supposed to have stress ECHO ordered by EP dr.      Craven Do started today 1PM, has had it happen before but this is first recently Waxing and waning jaw symptoms, back of neck pain over the last 2 weeks. Acupuncture helped jaw then flared up.  Not necessarily worse with walking, more head movement.  Today jaw calmed down (acupuncture tues/wed)   No chest pain, has had reflux this week but has been upset No dyspnea, maybe a little last night, using a roller last night and then felt some heart racing No vomiting, did have nausea 2 days ago. Tums this week. Today  nausea. Diarrhea earlier today. Abdominal pain a few days ago but better today.  Denies numbness, weakness, difficulty talking or walking, visual changes or facial droop.  No difficulty walking but feels sort of off balance, definitely last weekened started feeling that way.  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  With the neck stuff if has flare up gets occiptial headache, migraine .  Feeling of being off balance has not really been constant since last week.  Last week had times feeling ok.  Probably has felt this way before. Had nystagmus and vertigo, had seen neurologist.  Has been able to walk to bathroom here, but is feeling like needing to keep self steady.    No new medications. 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 Authorizing Provider  famotidine (  PEPCID) 20 MG tablet Take 20 mg by mouth daily. Patient not taking: Reported on 11/24/2022    [provider]  nystatin cream (MYCOSTATIN) Apply 1 application topically 2 (two) times daily. Patient not taking: Reported on 11/24/2022    [provider]      Allergies    Ciprofloxacin, Adhesive [tape], Delafloxacin, Levofloxacin, Moxifloxacin, Nsaids, and Prednisone     Review of Systems   Review of Systems  Physical Exam Updated Vital Signs BP (!) 160/90 (BP Location: Right Arm)   Pulse 77   Temp (!) 97.4 F (36.3 C)   Resp 18   Ht 5\' 8"  (1.727 m)   Wt 87.1 kg   LMP 04/13/2024   SpO2 100%   BMI 29.19 kg/m   Physical Exam  ED Results / Procedures / Treatments   Labs (all labs ordered are listed, but only abnormal results are displayed) Labs Reviewed  COMPREHENSIVE METABOLIC PANEL WITH GFR - Abnormal; Notable for the following components:      Result Value   Sodium 131 (*)    Chloride 96 (*)    Glucose, Bld 112 (*)    All other components within normal limits  CBC  PREGNANCY, URINE  TROPONIN T, HIGH SENSITIVITY  TROPONIN T, HIGH SENSITIVITY    EKG None  Radiology DG Chest 2 View Result Date: 04/25/2024 CLINICAL DATA:  Chest pain EXAM: CHEST - 2 VIEW COMPARISON:  Chest x-ray 12/26/2018 questionable small focal FINDINGS: The heart size and mediastinal contours are within normal limits. Density seen in the medial right lower lung. The lungs are otherwise clear. There is no pleural effusion or pneumothorax. The cardiomediastinal silhouette is within normal limits. No acute fractures are seen. The visualized skeletal structures are unremarkable. IMPRESSION: Density seen in the medial right lower lung, possibly atelectasis or pneumonia. Follow-up chest x-ray in 4-6 weeks is recommended to ensure resolution. Electronically Signed   By: Tyron Gallon M.D.   On: 04/25/2024 17:33    Procedures Procedures  {Document cardiac monitor, telemetry assessment procedure when appropriate:1}  Medications Ordered in ED Medications - No data to display  ED Course/ Medical Decision Making/ A&P   {   Click here for ABCD2, HEART and other calculatorsREFRESH Note before signing :1}                              Medical Decision Making Amount and/or Complexity of Data Reviewed Labs: ordered. Radiology: ordered.   ***   Labs completed and personally evaluated interpreted by me show negative pregnancy test, no anemia or leukocytosis.  Mild hyponatremia similar to prior without other clinically significant electrolyte abnormalities.  EKG completed and personally eval and interpreted by me shows a  normal sinus rhythm without acute ST changes.  Chest x-ray was completed and personally abided by myself and radiology shows no significant pulmonary edema, no pneumothorax, does show a density in the medial right lower lung which could be atelectasis or pneumonia**  {Document critical care time when appropriate:1} {Document review of labs and clinical decision tools ie heart score, Chads2Vasc2 etc:1}  {Document your independent review of radiology images, and any outside records:1} {Document your discussion with family members, caretakers, and with consultants:1} {Document social determinants of health affecting pt's care:1} {Document your decision making why or why not admission, treatments were needed:1} Final Clinical Impression(s) / ED Diagnoses Final diagnoses:  None    Rx / DC Orders ED Discharge Orders  None
